# Patient Record
Sex: Female | Born: 1980 | Race: White | Hispanic: No | Marital: Married | Smoking: Former smoker
Health system: Southern US, Community
[De-identification: ages and names within clinical notes are randomized; demographics above are authoritative.]

## PROBLEM LIST (undated history)

## (undated) DIAGNOSIS — K602 Anal fissure, unspecified: Secondary | ICD-10-CM

## (undated) DIAGNOSIS — L811 Chloasma: Secondary | ICD-10-CM

## (undated) DIAGNOSIS — D229 Melanocytic nevi, unspecified: Secondary | ICD-10-CM

## (undated) HISTORY — PX: WISDOM TOOTH EXTRACTION: SHX21

## (undated) HISTORY — PX: CHOLECYSTECTOMY: SHX55

## (undated) HISTORY — DX: Anal fissure, unspecified: K60.2

## (undated) HISTORY — DX: Melanocytic nevi, unspecified: D22.9

## (undated) HISTORY — DX: Chloasma: L81.1

---

## 2006-05-06 ENCOUNTER — Ambulatory Visit: Payer: Self-pay | Admitting: Family Medicine

## 2006-05-06 DIAGNOSIS — R1011 Right upper quadrant pain: Secondary | ICD-10-CM | POA: Insufficient documentation

## 2006-05-06 DIAGNOSIS — D239 Other benign neoplasm of skin, unspecified: Secondary | ICD-10-CM | POA: Insufficient documentation

## 2006-05-11 ENCOUNTER — Encounter: Payer: Self-pay | Admitting: Family Medicine

## 2006-05-12 ENCOUNTER — Telehealth: Payer: Self-pay | Admitting: Family Medicine

## 2006-05-25 ENCOUNTER — Encounter: Payer: Self-pay | Admitting: Family Medicine

## 2006-05-25 ENCOUNTER — Ambulatory Visit: Payer: Self-pay | Admitting: Cardiology

## 2006-05-31 ENCOUNTER — Ambulatory Visit: Payer: Self-pay | Admitting: Cardiology

## 2006-05-31 ENCOUNTER — Encounter: Payer: Self-pay | Admitting: Cardiology

## 2006-06-01 ENCOUNTER — Encounter: Payer: Self-pay | Admitting: Family Medicine

## 2006-06-20 ENCOUNTER — Ambulatory Visit: Payer: Self-pay | Admitting: Cardiology

## 2006-06-24 ENCOUNTER — Encounter: Payer: Self-pay | Admitting: Gastroenterology

## 2006-07-14 ENCOUNTER — Encounter: Payer: Self-pay | Admitting: Family Medicine

## 2006-10-26 ENCOUNTER — Ambulatory Visit: Payer: Self-pay | Admitting: Family Medicine

## 2006-10-26 DIAGNOSIS — K644 Residual hemorrhoidal skin tags: Secondary | ICD-10-CM | POA: Insufficient documentation

## 2006-12-29 ENCOUNTER — Ambulatory Visit: Payer: Self-pay | Admitting: Obstetrics & Gynecology

## 2006-12-29 ENCOUNTER — Encounter: Payer: Self-pay | Admitting: Obstetrics & Gynecology

## 2007-01-10 ENCOUNTER — Ambulatory Visit: Payer: Self-pay | Admitting: Cardiovascular Disease

## 2007-01-10 ENCOUNTER — Ambulatory Visit: Payer: Self-pay | Admitting: Cardiology

## 2007-01-10 LAB — CONVERTED CEMR LAB
Chloride: 101 meq/L (ref 96–112)
GFR calc Af Amer: 86 mL/min
Potassium: 3.5 meq/L (ref 3.5–5.1)
Sodium: 136 meq/L (ref 135–145)

## 2007-01-11 ENCOUNTER — Ambulatory Visit (HOSPITAL_COMMUNITY): Admission: RE | Admit: 2007-01-11 | Discharge: 2007-01-11 | Payer: Self-pay | Admitting: Cardiology

## 2007-10-20 ENCOUNTER — Ambulatory Visit: Payer: Self-pay | Admitting: Women's Health

## 2007-11-10 ENCOUNTER — Ambulatory Visit: Payer: Self-pay | Admitting: Women's Health

## 2007-11-13 ENCOUNTER — Ambulatory Visit: Payer: Self-pay | Admitting: Women's Health

## 2007-11-28 ENCOUNTER — Ambulatory Visit: Payer: Self-pay | Admitting: Gynecology

## 2007-11-30 ENCOUNTER — Ambulatory Visit (HOSPITAL_BASED_OUTPATIENT_CLINIC_OR_DEPARTMENT_OTHER): Admission: RE | Admit: 2007-11-30 | Discharge: 2007-11-30 | Payer: Self-pay | Admitting: Gynecology

## 2007-11-30 ENCOUNTER — Ambulatory Visit: Payer: Self-pay | Admitting: Gynecology

## 2007-11-30 ENCOUNTER — Encounter: Payer: Self-pay | Admitting: Gynecology

## 2007-12-13 ENCOUNTER — Ambulatory Visit: Payer: Self-pay | Admitting: Gynecology

## 2008-02-19 ENCOUNTER — Ambulatory Visit: Payer: Self-pay | Admitting: Gynecology

## 2008-02-19 ENCOUNTER — Encounter: Payer: Self-pay | Admitting: Gynecology

## 2008-02-19 ENCOUNTER — Other Ambulatory Visit: Admission: RE | Admit: 2008-02-19 | Discharge: 2008-02-19 | Payer: Self-pay | Admitting: Gynecology

## 2008-05-22 ENCOUNTER — Ambulatory Visit: Payer: Self-pay | Admitting: Gynecology

## 2008-06-05 ENCOUNTER — Ambulatory Visit: Payer: Self-pay | Admitting: Gynecology

## 2009-01-24 ENCOUNTER — Inpatient Hospital Stay (HOSPITAL_COMMUNITY): Admission: RE | Admit: 2009-01-24 | Discharge: 2009-01-26 | Payer: Self-pay | Admitting: Obstetrics and Gynecology

## 2009-04-16 ENCOUNTER — Ambulatory Visit: Payer: Self-pay | Admitting: Family Medicine

## 2009-04-16 DIAGNOSIS — L819 Disorder of pigmentation, unspecified: Secondary | ICD-10-CM | POA: Insufficient documentation

## 2009-04-16 DIAGNOSIS — R1013 Epigastric pain: Secondary | ICD-10-CM | POA: Insufficient documentation

## 2009-04-17 ENCOUNTER — Encounter: Payer: Self-pay | Admitting: Family Medicine

## 2009-04-17 LAB — CONVERTED CEMR LAB
ALT: 30 units/L (ref 0–35)
AST: 22 units/L (ref 0–37)
Albumin: 4.5 g/dL (ref 3.5–5.2)
Alkaline Phosphatase: 79 units/L (ref 39–117)
Amylase: 40 units/L (ref 0–105)
Basophils Absolute: 0 10*3/uL (ref 0.0–0.1)
Basophils Relative: 0 % (ref 0–1)
Eosinophils Absolute: 0.4 10*3/uL (ref 0.0–0.7)
Eosinophils Relative: 5 % (ref 0–5)
Glucose, Bld: 70 mg/dL (ref 70–99)
Hemoglobin: 14.5 g/dL (ref 12.0–15.0)
Lipase: 23 units/L (ref 0–75)
Lymphocytes Relative: 35 % (ref 12–46)
MCHC: 32.8 g/dL (ref 30.0–36.0)
Monocytes Absolute: 0.7 10*3/uL (ref 0.1–1.0)
Monocytes Relative: 9 % (ref 3–12)
Neutro Abs: 3.7 10*3/uL (ref 1.7–7.7)
Total Bilirubin: 0.7 mg/dL (ref 0.3–1.2)
Total Protein: 6.6 g/dL (ref 6.0–8.3)

## 2009-04-28 ENCOUNTER — Telehealth: Payer: Self-pay | Admitting: Family Medicine

## 2009-04-29 ENCOUNTER — Encounter (INDEPENDENT_AMBULATORY_CARE_PROVIDER_SITE_OTHER): Payer: Self-pay | Admitting: *Deleted

## 2009-05-02 ENCOUNTER — Telehealth: Payer: Self-pay | Admitting: Gastroenterology

## 2009-05-02 ENCOUNTER — Telehealth: Payer: Self-pay | Admitting: Family Medicine

## 2009-05-14 ENCOUNTER — Ambulatory Visit: Payer: Self-pay | Admitting: Gastroenterology

## 2009-05-14 DIAGNOSIS — K602 Anal fissure, unspecified: Secondary | ICD-10-CM | POA: Insufficient documentation

## 2009-05-19 ENCOUNTER — Telehealth: Payer: Self-pay | Admitting: Gastroenterology

## 2009-05-20 ENCOUNTER — Ambulatory Visit (HOSPITAL_COMMUNITY): Admission: RE | Admit: 2009-05-20 | Discharge: 2009-05-20 | Payer: Self-pay | Admitting: Gastroenterology

## 2009-05-28 ENCOUNTER — Ambulatory Visit: Payer: Self-pay | Admitting: Gastroenterology

## 2009-06-26 ENCOUNTER — Telehealth: Payer: Self-pay | Admitting: Gastroenterology

## 2009-06-30 ENCOUNTER — Ambulatory Visit: Payer: Self-pay | Admitting: Cardiology

## 2009-06-30 DIAGNOSIS — Q231 Congenital insufficiency of aortic valve: Secondary | ICD-10-CM | POA: Insufficient documentation

## 2010-02-12 ENCOUNTER — Encounter: Payer: Self-pay | Admitting: Gastroenterology

## 2010-02-12 ENCOUNTER — Ambulatory Visit
Admission: RE | Admit: 2010-02-12 | Discharge: 2010-02-12 | Payer: Self-pay | Source: Home / Self Care | Attending: Gastroenterology | Admitting: Gastroenterology

## 2010-02-17 ENCOUNTER — Encounter: Payer: Self-pay | Admitting: Gastroenterology

## 2010-02-17 ENCOUNTER — Ambulatory Visit
Admission: RE | Admit: 2010-02-17 | Discharge: 2010-02-17 | Payer: Self-pay | Source: Home / Self Care | Attending: Gastroenterology | Admitting: Gastroenterology

## 2010-02-18 ENCOUNTER — Encounter: Payer: Self-pay | Admitting: Gastroenterology

## 2010-03-06 ENCOUNTER — Other Ambulatory Visit: Payer: Self-pay | Admitting: Obstetrics and Gynecology

## 2010-03-09 ENCOUNTER — Encounter: Payer: Self-pay | Admitting: Gastroenterology

## 2010-03-10 NOTE — Op Note (Signed)
Summary: Cholecystectomy/[facility]   Imported By: Sherian Rein 05/19/2009 08:42:43  _____________________________________________________________________  External Attachment:    Type:   Image     Comment:   External Document

## 2010-03-10 NOTE — Letter (Signed)
Summary: Results Letter  [facility]  [address] [place], [place] [address]   Phone: [phone]  Fax: [phone]        May 14, 2009 MRN: [id number]    [facility] [address] [place], [place]  [address]    Dear [name],  It is my pleasure to have treated you recently as a new patient in my office. I appreciate your confidence and the opportunity to participate in your care.  Since I do have a busy inpatient endoscopy schedule and office schedule, my office hours vary weekly. I am, however, available for emergency calls everyday through my office. If I am not available for an urgent office appointment, another one of our gastroenterologist will be able to assist you.  My well-trained staff are prepared to help you at all times. For emergencies after office hours, a physician from our Gastroenterology section is always available through my 24 hour answering service  Once again I welcome you as a new patient and I look forward to a happy and healthy relationship             Sincerely,  Louis Meckel MD  This letter has been electronically signed by your physician.  Appended Document: Results Letter Letter mailed to patient.

## 2010-03-10 NOTE — Letter (Signed)
Summary: New Patient letter  [facility]  [address] [place], [place] [address]   Phone: [phone]  Fax: [phone]       04/29/2009 MRN: [id number]  [facility] [place] [address] [place], [place]  [address]  Dear Ms. Errico,  Welcome to the Gastroenterology Division at [facility].    You are scheduled to see Dr.  Melvia Heaps on May 28, 2009 at 10:00am on the 3rd floor at [facility], [address]  We ask that you try to arrive at our office 15 minutes prior to your appointment time to allow for check-in.  We would like you to complete the enclosed self-administered evaluation form prior to your visit and bring it with you on the day of your appointment.  We will review it with you.  Also, please bring a complete list of all your medications or, if you prefer, bring the medication bottles and we will list them.  Please bring your insurance card so that we may make a copy of it.  If your insurance requires a referral to see a specialist, please bring your referral form from your primary care physician.  Co-payments are due at the time of your visit and may be paid by cash, check or credit card.     Your office visit will consist of a consult with your physician (includes a physical exam), any laboratory testing he/she may order, scheduling of any necessary diagnostic testing (e.g. x-ray, ultrasound, CT-scan), and scheduling of a procedure (e.g. Endoscopy, Colonoscopy) if required.  Please allow enough time on your schedule to allow for any/all of these possibilities.    If you cannot keep your appointment, please call [phone] to cancel or reschedule prior to your appointment date.  This allows [place] the opportunity to schedule an appointment for another patient in need of care.  If you do not cancel or reschedule by 5 p.m. the business day prior to your appointment date, you will be charged a $50.00 late cancellation/no-show fee.    Thank you for  choosing [facility] for your medical needs.  We appreciate the opportunity to care for you.  Please visit [place] at our website  to learn more about our practice.                     Sincerely,                                                             The Gastroenterology Division

## 2010-03-10 NOTE — Progress Notes (Signed)
Summary: Rx did not get her prescriptions yet  Phone Note Call from Patient Call back at Work Phone [phone]   Call For: Dr Arlyce Dice Summary of Call: [facility] [address] [facility] and they have not received her Anusol or Hyomax yet. Initial call taken by: Leanor Kail [facility],  May 19, 2009 10:41 AM  Follow-up for Phone Call        rxs sent, pt aware Follow-up by: Harlow Mares CMA Duncan Dull),  May 19, 2009 11:17 AM    Prescriptions: ANUSOL-HC 25 MG SUPP (HYDROCORTISONE ACETATE) take one suppository q.h.s.  #7 x 3   Entered by:   Harlow Mares CMA (AAMA)   Authorized by:   Louis Meckel MD   Signed by:   Harlow Mares CMA (AAMA) on 05/19/2009   Method used:   Electronically to        [facility] ([facility])       [address]       [place], [place]  [address]       Ph: [phone]       Fax: [phone]   RxID:   [id number] HYOMAX-SL 0.125 MG SUBL (HYOSCYAMINE SULFATE) take 2 tablets sublingual q.4 h. p.r.n.  #20 x 1   Entered by:   Harlow Mares CMA (AAMA)   Authorized by:   Louis Meckel MD   Signed by:   Harlow Mares CMA (AAMA) on 05/19/2009   Method used:   Electronically to        [facility])       [address]       [place], [place]  [address]       Ph: [phone]       Fax: [phone]   RxID:   [id number]

## 2010-03-10 NOTE — Progress Notes (Signed)
Summary: med for abd. pain?  Phone Note Call from Patient   Caller: Patient  [phone] Call For: Nani Gasser MD Summary of Call: Pt. called and stated that she can't see GI until April. She is out of Protonix samples. She states she needs something to take for the abdominal pain. Feels the Protonix helped dull the pain and would rx for that. Can we send in rx?  Mervin Kung CMA  May 02, 2009 12:35 PM   Follow-up for Phone Call        Yes, can fill protonix.  Follow-up by: Nani Gasser MD,  May 05, 2009 7:56 AM    New/Updated Medications: PROTONIX 40 MG TBEC (PANTOPRAZOLE SODIUM) Take 1 tablet by mouth once a day Prescriptions: PROTONIX 40 MG TBEC (PANTOPRAZOLE SODIUM) Take 1 tablet by mouth once a day  #30 x 1   Entered and Authorized by:   Nani Gasser MD   Signed by:   Nani Gasser MD on 05/05/2009   Method used:   Electronically to        [facility]       [address]       [place], [place]  [address]       Ph: [phone]       Fax: [phone]   RxID:   [id number]

## 2010-03-10 NOTE — Progress Notes (Signed)
Summary: Abdominal pain  Phone Note Call from Patient Call back at [phone]   Caller: Patient Call For: Nani Gasser MD Summary of Call: Pt put on the Protonix for abdominal pain and is out of it and feels it did not help her. Initial call taken by: Kathlene November,  April 28, 2009 8:12 AM  Follow-up for Phone Call        REc referral to GI for further evaluation.  Follow-up by: Nani Gasser MD,  April 28, 2009 8:13 AM  Additional Follow-up for Phone Call Additional follow up Details #1::        Pt notified of MD instructions Additional Follow-up by: Kathlene November,  April 28, 2009 8:49 AM

## 2010-03-10 NOTE — Assessment & Plan Note (Signed)
Summary: EPIGASTRIC PAIN...EM   History of Present Illness Visit Type: Initial Consult Primary GI MD: Melvia Heaps MD [facility] Primary Provider: Nani Gasser, MD Requesting Provider: Nani Gasser, MD Chief Complaint: Constant epigastric pain that radiates to back, pain last 10 to 30 minute intervals. History of Present Illness:   [name] is a pleasant 30 year old white female referred at the request of Dr. Linford Arnold for evaluation of abdominal pain.  Over the past 3 months she has been complaining of episodic severe epigastric pain.  Patient may awaken her.  Pain radiates in a bandlike distribution around both sides to her back.  It may last up to 30 minutes.  It is unrelated to eating.  It is not accompanied by nausea or vomiting.  There is no history of pyrosis.  Patient is status post cholecystectomy.  Intraoperative cholangiogram was normal.  Recent liver tests were normal.  There is no history of jaundice  Patient also complains of moderately severe rectal discomfort with a bowel movement.  She denies rectal bleeding.   GI Review of Systems    Reports abdominal pain.     Location of  Abdominal pain: epigastric area.    Denies acid reflux, belching, bloating, chest pain, dysphagia with liquids, dysphagia with solids, heartburn, loss of appetite, nausea, vomiting, vomiting blood, weight loss, and  weight gain.      Reports hemorrhoids.     Denies anal fissure, black tarry stools, change in bowel habit, constipation, diarrhea, diverticulosis, fecal incontinence, heme positive stool, irritable bowel syndrome, jaundice, light color stool, liver problems, rectal bleeding, and  rectal pain.    Current Medications (verified): 1)  Mirena 20 Mcg/24hr Iud (Levonorgestrel) 2)  Concept Dha 53.5-38-1 Mg Caps (Prenat-Fefum-Fepo-Fa-Omega 3) .... Take One Tablet By Mouth Once A Day  Allergies (verified): No Known Drug Allergies  Past History:  Past Medical History: Reviewed history  from 05/06/2006 and no changes required. Hx melanoma 07/17/2005  Past Surgical History: Reviewed history from 07/14/2006 and no changes required. Wisdom Teeth removed 07/1997 Cholecystectomy 07-18-06 by Dr. Manus Rudd  Family History: Father- Cardiomyopathy- deceased 07/18/1999 2 Brothers- Enlarged Aorta Family History of Colitis/Crohn's:Father  Social History: Reviewed history from 04/16/2009 and no changes required. Pt. is married to Yahoo. 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 Former Smoker Alcohol use-yes Regular exercise-yes  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  Review of Systems  The patient denies allergy/sinus, anemia, anxiety-new, arthritis/joint pain, back pain, blood in urine, breast changes/lumps, change in vision, confusion, cough, coughing up blood, depression-new, fainting, fatigue, fever, headaches-new, hearing problems, heart murmur, heart rhythm changes, itching, menstrual pain, muscle pains/cramps, night sweats, nosebleeds, pregnancy symptoms, shortness of breath, skin rash, sleeping problems, sore throat, swelling of feet/legs, swollen lymph glands, thirst - excessive , urination - excessive , urination changes/pain, urine leakage, vision changes, and voice change.    Vital Signs:  Patient profile:   30 year old female Height:      162 inches Weight:      64 pounds BMI:     1.72 Pulse rate:   64 / minute Pulse rhythm:   regular BP sitting:   104 / 70  (left arm) Cuff size:   regular  Vitals Entered By: [name] CMA Duncan Dull) (May 14, 2009 9:00 AM)  Physical Exam  Additional Exam:  On physical exam she is a well-developed large female  skin: anicteric HEENT: normocephalic; PEERLA; no nasal or pharyngeal abnormalities neck: supple nodes: no cervical lymphadenopathy  chest: clear to ausculatation and percussion heart: no murmurs, gallops, or rubs abd: soft, nontender; BS normoactive; no abdominal masses, tenderness,  organomegaly rectal: no masses; a posterior sessile polyps present with a midline fissure ext: no cynanosis, clubbing, edema skeletal: no deformities neuro: oriented x 3; no focal abnormalities    Impression & Recommendations:  Problem # 1:  EPIGASTRIC PAIN (ICD-789.06)  Abdominal pain is suggestive of biliary tract disease such as a retained stone or biliary dyskinesia.  Presentation is atypical for peptic ulcer disease.  Recommendations #1 trial of hyomax p.r.n. pain #2 abdominal ultrasound #3 stat LFTs, amylase and lipase during episodes of pain  Orders: Ultrasound Abdomen (UAS)  Problem # 2:  ANAL FISSURE (ICD-565.0) Recommend warm soaks and Anusol-HC suppositories  Patient Instructions: 1)  Please pick up your medications at your pharmacy.  2)  Your ultrasound is scheduled at [facility] on 05/20/09.  See seperate instructions. 3)  Please call our office when you are having an episode of acute pain so we can order labs. 4)  Please schedule a follow-up appointment in 2 weeks.  5)  Copy sent to : Nani Gasser, MD 6)  The medication list was reviewed and reconciled.  All changed / newly prescribed medications were explained.  A complete medication list was provided to the patient / caregiver. Prescriptions: ANUSOL-HC 25 MG SUPP (HYDROCORTISONE ACETATE) take one suppository q.h.s.  #7 x 3   Entered and Authorized by:   Louis Meckel MD   Signed by:   Louis Meckel MD on 05/14/2009   Method used:   Historical   RxID:   [id number] HYOMAX-SL 0.125 MG SUBL (HYOSCYAMINE SULFATE) take 2 tablets sublingual q.4 h. p.r.n.  #20 x 1   Entered and Authorized by:   Louis Meckel MD   Signed by:   Louis Meckel MD on 05/14/2009   Method used:   Historical   RxID:   [id number]

## 2010-03-10 NOTE — Progress Notes (Signed)
Summary: triage  Phone Note Call from Patient Call back at Home Phone [phone]   Caller: Patient Call For: Dr. Arlyce Dice Reason for Call: Talk to Nurse Summary of Call: pt said that when she had high abd pain again, Dr. Arlyce Dice told her that she needed to come in asap for labwork while she was having the pain Initial call taken by: Vallarie Mare,  Jun 26, 2009 3:18 PM  Follow-up for Phone Call        Pt. contacted and told to come over for LFT's but the pain is now gone as it only lasted 5 or 6 minutes. Follow-up by: Teryl Lucy RN,  Jun 26, 2009 3:26 PM

## 2010-03-10 NOTE — Assessment & Plan Note (Signed)
Summary: f/u from ultrasound--ch.   History of Present Illness Visit Type: Follow-up Visit Primary GI MD: Melvia Heaps MD [facility] Primary Provider: Nani Gasser, MD Requesting Provider: n/a Chief Complaint: discuss ultrasound, pt is having less severe pain, but more frequently History of Present Illness:   [name] has returned for followup of her abdominal pain and her anal fissure.  She has not had any episodes of severe pain since her last visit.  Abdominal ultrasound was unrevealing.  She has not had occasion to take anticholinergics her anal fissure is considerably improved with suppositories.   GI Review of Systems    Reports abdominal pain.     Location of  Abdominal pain: epigastric area.    Denies acid reflux, belching, bloating, chest pain, dysphagia with liquids, dysphagia with solids, heartburn, loss of appetite, nausea, vomiting, vomiting blood, weight loss, and  weight gain.        Denies anal fissure, black tarry stools, change in bowel habit, constipation, diarrhea, diverticulosis, fecal incontinence, heme positive stool, hemorrhoids, irritable bowel syndrome, jaundice, light color stool, liver problems, rectal bleeding, and  rectal pain. Preventive Screening-Counseling & Management      Drug Use:  no.      Current Medications (verified): 1)  Mirena 20 Mcg/24hr Iud (Levonorgestrel) 2)  Concept Dha 53.5-38-1 Mg Caps (Prenat-Fefum-Fepo-Fa-Omega 3) .... Take One Tablet By Mouth Once A Day 3)  Hyomax-Sl 0.125 Mg Subl (Hyoscyamine Sulfate) .... Take 2 Tablets Sublingual Q.4 H. P.r.n. 4)  Anusol-Hc 25 Mg  Supp (Hydrocortisone Acetate) .... Insert One Suppository Rectally At Bedtime  Allergies (verified): No Known Drug Allergies  Past History:  Past Medical History: Hx melanoma 2007 Anal Fissure Hemorrhoids  Past Surgical History: Reviewed history from 07/14/2006 and no changes required. Wisdom Teeth removed 07/1997 Cholecystectomy 06/2006 by Dr. Manus Rudd  Family History: Reviewed history from 05/14/2009 and no changes required. Father- Cardiomyopathy- deceased 4 2 Brothers- Enlarged Aorta Family History of Colitis/Crohn's:Father  Social History: Pt. is married to [employer]. 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 Former Smoker Alcohol use-yes Regular exercise-yes Illicit Drug Use - no Drug Use:  no  Review of Systems  The patient denies allergy/sinus, anemia, anxiety-new, arthritis/joint pain, back pain, blood in urine, breast changes/lumps, confusion, cough, coughing up blood, depression-new, fainting, fatigue, fever, headaches-new, hearing problems, heart murmur, heart rhythm changes, itching, menstrual pain, muscle pains/cramps, night sweats, nosebleeds, pregnancy symptoms, shortness of breath, skin rash, sleeping problems, sore throat, swelling of feet/legs, swollen lymph glands, thirst - excessive, urination - excessive, urination changes/pain, urine leakage, vision changes, and voice change.    Vital Signs:  Patient profile:   30 year old female Height:      64 inches Weight:      159 pounds BMI:     27.39 Pulse rate:   72 / minute Pulse rhythm:   regular BP sitting:   100 / 60  (left arm) Cuff size:   regular  Vitals Entered By: Francee Piccolo CMA Duncan Dull) (May 28, 2009 2:23 PM)   Impression & Recommendations:  Problem # 1:  RUQ PAIN (ICD-789.01) Etiology of her pain is uncertain.  I carefully instructed her to take hyomax with the onset of pain in the hope that it will aborted altogether.  If pain worsens despite hyomax she was instructed to contact me where we can check some liver tests ASAP.  Problem # 2:  ANAL FISSURE (ICD-565.0) Assessment: Improved  Patient  Instructions: 1)  Copy sent to : Michell Heinrich 2)  The medication list was reviewed and reconciled.  All changed / newly prescribed medications were explained.  A complete medication list was  provided to the patient / caregiver.
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
## 2010-03-10 NOTE — Assessment & Plan Note (Signed)
Summary: F3Y/ PER CHECK OUT/ GD   Primary Provider:  Nani Gasser, MD   History of Present Illness: Mrs. Deamer  pleasant female who has a family history of cardiomyopathy, dilated aorta and bicuspid aortic valve.  An echocardiogram performed on May 31, 2006 showed normal LV function.  Her aortic root apparently was not dilated and the aortic valve was not bicuspid. Cardiac MRI 12/08 revealed no evidence of aortic aneurysm.  Ascending aorta measured 27 mm. The descending aorta measured 15 mm. No evidence of cardiomyopathy.  Quantitative ejection fraction 59% with normal LV cavity size. The aortic valve did not appear bicuspid or morphologically abnormal. Last seen 5/08. Since then, the patient denies any dyspnea on exertion, orthopnea, PND, pedal edema, palpitations, syncope or chest pain.   Current Medications (verified): 1)  Mirena 20 Mcg/24hr Iud (Levonorgestrel) .... As Drected 2)  Concept Dha 53.5-38-1 Mg Caps (Prenat-Fefum-Fepo-Fa-Omega 3) .... Take One Tablet By Mouth Once A Day 3)  Hyomax-Sl 0.125 Mg Subl (Hyoscyamine Sulfate) .... Take 2 Tablets Sublingual Q.4 H. P.r.n. 4)  Anusol-Hc 25 Mg  Supp (Hydrocortisone Acetate) .... Insert One Suppository Rectally At Bedtime  Allergies: No Known Drug Allergies  Past History:  Past Medical History: Hx melanoma 2007 Anal Fissure Hemorrhoids  Past Surgical History: Reviewed history from 07/14/2006 and no changes required. Wisdom Teeth removed 07/1997 Cholecystectomy 06/2006 by Dr. Manus Rudd  Social History: Reviewed history from 05/28/2009 and no changes required. Pt. is married to Yahoo. 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 Former Smoker Alcohol use-yes Regular exercise-yes Illicit Drug Use - no  Review of Systems       no fevers or chills, productive cough, hemoptysis, dysphasia, odynophagia, melena, hematochezia, dysuria, hematuria, rash, seizure activity,  orthopnea, PND, pedal edema, claudication. Remaining systems are negative.   Vital Signs:  Patient profile:   30 year old female Height:      64 inches Weight:      155 pounds BMI:     26.70 Pulse rate:   75 / minute BP sitting:   112 / 66  Vitals Entered By: Kem Parkinson (Jun 30, 2009 3:23 PM)  Physical Exam  General:  Well-developed well-nourished in no acute distress.  Skin is warm and dry.  HEENT is normal.  Neck is supple. No thyromegaly.  Chest is clear to auscultation with normal expansion.  Cardiovascular exam is regular rate and rhythm.  Abdominal exam nontender or distended. No masses palpated. Extremities show no edema. neuro grossly intact    EKG  Procedure date:  06/30/2009  Findings:      Normal sinus rhythm at a rate of 75. Right axis deviation. No ST changes.  Impression & Recommendations:  Problem # 1:  BICUSPID AORTIC VALVE (ICD-746.4) There is a family history of bicuspid aortic valve, thoracic aortic aneurysm and cardiomyopathy by the patient's report. However previous echocardiogram and cardiac MRI did not reveal a bicuspid valve and her LV function was normal. I will see her back in 2 years and most likely repeat her echocardiogram at that time. Note she is not having any cardiac symptoms including no chest pain or shortness of breath.  Problem # 2:  ANAL FISSURE (ICD-565.0)  Other Orders: EKG w/ Interpretation (93000)  Patient Instructions: 1)  Your physician recommends that you schedule a follow-up appointment in: 2 years with Dr. Jens Som 2)  Your physician recommends that you continue on your current medications as directed. Please refer  to the Current Medication list given to you today.

## 2010-03-10 NOTE — Progress Notes (Signed)
Summary: sooner appt.  Phone Note Call from Patient Call back at [facility] Phone [phone]   Caller: Patient Call For: Dr. Arlyce Dice Reason for Call: Talk to Nurse Summary of Call: Pt. has an appt. on 05-28-09. Having severe abd pain and feels like it is radiating into her back. Would like a sooner appt. Initial call taken by: Karna Christmas,  May 02, 2009 8:53 AM  Follow-up for Phone Call        Appt. moved up to 05/14/2009. Follow-up by: Teryl Lucy RN,  May 02, 2009 9:54 AM

## 2010-03-12 NOTE — Letter (Signed)
Summary: EGD Instructions  [facility]  [address] [place], [place] [address]   Phone: [phone]  Fax: [phone]       [name]    Oct 21, 1980    MRN: [id number]       Procedure Day /Date:02/17/2010 TUESDAY     Arrival Time: 9:30AM     Procedure Time:10:30AM     Location of Procedure:                    [facility] (4th Floor)  PREPARATION FOR ENDOSCOPY   On 02/17/2010  THE DAY OF THE PROCEDURE:  1.   No solid foods, milk or milk products are allowed after midnight the night before your procedure.  2.   Do not drink anything colored red or purple.  Avoid juices with pulp.  No orange juice.  3.  You may drink clear liquids until8:30AM , which is 2 hours before your procedure.                                                                                                CLEAR LIQUIDS INCLUDE: Water Jello Ice Popsicles Tea (sugar ok, no milk/cream) Powdered fruit flavored drinks Coffee (sugar ok, no milk/cream) Gatorade Juice: apple, white grape, white cranberry  Lemonade Clear bullion, consomm, broth Carbonated beverages (any kind) Strained chicken noodle soup Hard Candy   MEDICATION INSTRUCTIONS  Unless otherwise instructed, you should take regular prescription medications with a small sip of water as early as possible the morning of your procedure.           OTHER INSTRUCTIONS  You will need a responsible adult at least 31 years of age to accompany you and drive you home.   This person must remain in the waiting room during your procedure.  Wear loose fitting clothing that is easily removed.  Leave jewelry and other valuables at home.  However, you may wish to bring a book to read or an iPod/MP3 player to listen to music as you wait for your procedure to start.  Remove all body piercing jewelry and leave at home.  Total time from sign-in until discharge is approximately 2-3 hours.  You should go home directly  after your procedure and rest.  You can resume normal activities the day after your procedure.  The day of your procedure you should not:   Drive   Make legal decisions   Operate machinery   Drink alcohol   Return to work  You will receive specific instructions about eating, activities and medications before you leave.    The above instructions have been reviewed and explained to me by   _______________________    I fully understand and can verbalize these instructions _____________________________ Date _________

## 2010-03-12 NOTE — Assessment & Plan Note (Signed)
Summary: upper abd pain, meds not helping...as.   History of Present Illness Visit Type: Follow-up Visit Primary GI MD: Melvia Heaps MD [facility] Primary Provider: Nani Gasser, MD Requesting Provider: n/a Chief Complaint: Upper abd pain and Hyomax not working  History of Present Illness:   Ms. Mcginty has returned for reevaluation of her abdominal pain.  Approximately once a week she has severe upper epigastric pain that radiates around both sides and to the back.  It may last for up to an hour at a time.  It is rated as a 10 out of 10.  She is taking hyomax but thnks it does not seem to help.  At the same time, pain may subside within half an hour or an hourafter  taking hyomax.  It is not accompanied by nausea vomiting or diarrhea.  There is no pattern to her episodes.  It is unrelated to eating and not affected by eating.  Cholecystectomy in Jul 09, 2006 demonstrated calculi.  I OC was normal.  At the time she was having  mild right upper quadrant pain.   GI Review of Systems    Reports abdominal pain.     Location of  Abdominal pain: upper abdomen.    Denies acid reflux, belching, bloating, chest pain, dysphagia with liquids, dysphagia with solids, heartburn, loss of appetite, nausea, vomiting, vomiting blood, weight loss, and  weight gain.        Denies anal fissure, black tarry stools, change in bowel habit, constipation, diarrhea, diverticulosis, fecal incontinence, heme positive stool, hemorrhoids, irritable bowel syndrome, jaundice, light color stool, liver problems, rectal bleeding, and  rectal pain.    Current Medications (verified): 1)  Mirena 20 Mcg/24hr Iud (Levonorgestrel) .... As Drected 2)  Hyomax-Sl 0.125 Mg Subl (Hyoscyamine Sulfate) .... Take 2 Tablets Sublingual Q.4 H. P.r.n. 3)  Anusol-Hc 25 Mg  Supp (Hydrocortisone Acetate) .... As Needed  Allergies (verified): No Known Drug Allergies  Past History:  Past Medical History: Hx melanoma 07/08/05:  BICUSPID AORTIC VALVE  (ICD-746.4) ANAL FISSURE (ICD-565.0) MELASMA (ICD-709.09) EPIGASTRIC PAIN (ICD-789.06) HEMORRHOIDS, EXTERNAL W/O COMPLICATION (ICD-455.3) NEVUS, ATYPICAL (ICD-216.9) RUQ PAIN (ICD-789.01) ISCHEMIC HEART DISEASE, PREMATURE, FAMILY HX (ICD-V17.3)  Past Surgical History: Reviewed history from 07/14/2006 and no changes required. Wisdom Teeth removed 07/1997 Cholecystectomy 07-09-2006 by Dr. Manus Rudd  Family History: Father- Cardiomyopathy- deceased 07/09/1999 2 Brothers- Enlarged Aorta Family History of Colitis/Crohn's:Father No FH of Colon Cancer:  Social History: Reviewed history from 05/28/2009 and no changes required. Pt. is married to [employer]. 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 Former Smoker Alcohol use-yes Regular exercise-yes Illicit Drug Use - no  Vital Signs:  Patient profile:   30 year old female Height:      64 inches Weight:      159 pounds BMI:     27.39 BSA:     1.78 Pulse rate:   88 / minute Pulse rhythm:   regular BP sitting:   124 / 72  (left arm) Cuff size:   regular  Vitals Entered By: Ok Anis CMA (February 12, 2010 1:55 PM)  Physical Exam  Additional Exam:  On physical exam she is a healthy-appearing female  skin: anicteric HEENT: normocephalic; PEERLA; no nasal or pharyngeal abnormalities neck: supple nodes: no cervical lymphadenopathy chest: clear to ausculatation and percussion heart: no murmurs, gallops, or rubs abd: soft, nontender; BS normoactive; no abdominal masses, tenderness, organomegaly rectal: deferred ext: no cynanosis, clubbing, edema skeletal: no deformities  neuro: oriented x 3; no focal abnormalities    Impression & Recommendations:  Problem # 1:  EPIGASTRIC PAIN (ICD-789.06) Assessment Unchanged Symptoms continue.  Etiology is uncertain.  She is not had a robust response to anticholinergics.  This would be an atypical presentation for peptic ulcer disease.  Pain from  biliary dyskinesia or recurrent/retained stones are less likely.  Recommendations #1 the patient was encouraged to take hyomax at the very onset of bowel discomfort #2 upper endoscopy #3 stat evaluation with CBC, amylase and LFTs during episodes of pain  Other Orders: EGD (EGD)  Patient Instructions: 1)  Copy sent to : Nani Gasser, MD 2)  Your EGD is scheduled for 02/17/2010 at 10:30pm 3)  Upper Endoscopy brochure given.  4)  The medication list was reviewed and reconciled.  All changed / newly prescribed medications were explained.  A complete medication list was provided to the patient / caregiver.

## 2010-03-12 NOTE — Procedures (Signed)
Summary: Upper Endoscopy  Patient: [name] Note: All result statuses are Final unless otherwise noted.  Tests: (1) Upper Endoscopy (EGD)   EGD Upper Endoscopy       DONE     [facility]     [address]     [place], [place]  [address]           ENDOSCOPY PROCEDURE REPORT           PATIENT:  [name], [name]  MR#:  [id number]     BIRTHDATE:  [birth year], 29 yrs. old  GENDER:  female           ENDOSCOPIST:  Barbette Hair. Arlyce Dice, MD     Referred by:  Nani Gasser, M.D.           PROCEDURE DATE:  02/17/2010     PROCEDURE:  EGD, diagnostic 43235     ASA CLASS:  Class I     INDICATIONS:  abdominal pain           MEDICATIONS:   Fentanyl 25 mcg IV, Versed 4 mg IV, glycopyrrolate     (Robinal) 0.2 mg IV, 0.6cc simethancone 0.6 cc PO     TOPICAL ANESTHETIC:  Exactacain Spray           DESCRIPTION OF PROCEDURE:   After the risks benefits and     alternatives of the procedure were thoroughly explained, informed     consent was obtained.  The [facility] [id number] endoscope was     introduced through the mouth and advanced to the third portion of     the duodenum, without limitations.  The instrument was slowly     withdrawn as the mucosa was fully examined.     <<PROCEDUREIMAGES>>           Esophagitis was found at the gastroesophageal junction (see image9     and image8). Grade B erosive esophagitis  Otherwise the     examination was normal.    Retroflexed views revealed no     abnormalities.    The scope was then withdrawn from the patient     and the procedure completed.           COMPLICATIONS:  None           ENDOSCOPIC IMPRESSION:     1) Esophagitis at the gastroesophageal junction     2) Otherwise normal examination     RECOMMENDATIONS:     1) Protonix 40 mg qd     2) Call office next 2-3 days to schedule an office appointment     for 4-6 weeks     3) STAT evaluation during episodes of severe pain (LFTs, amylase,     CBC)           REPEAT EXAM:  No           ______________________________     Barbette Hair. Arlyce Dice, MD           CC:           n.     eSIGNED:   Barbette Hair. Kaplan at 02/17/2010 10:59 AM           Delray Alt, [id number]  Note: An exclamation mark (!) indicates a result that was not dispersed into the flowsheet. Document Creation Date: 02/17/2010 10:59 AM _______________________________________________________________________  (1) Order result status: Final Collection or observation date-time: 02/17/2010 10:55 Requested date-time:  Receipt date-time:  Reported date-time:  Referring Physician:  Ordering Physician: Melvia Heaps [phone]) Specimen Source:  Source: Launa Grill Order Number: [id number] Lab site:

## 2010-03-12 NOTE — Letter (Signed)
Summary: Appt Reminder 2  [facility]  [address] [place], [place] [address]   Phone: [phone]  Fax: [phone]        February 18, 2010 MRN: [id number]    [facility] [place] [address] [place], [place]  [address]    Dear [name],   You have a return appointment with Dr. Arlyce Dice on 04/01/10 at 10:15am.  Please remember to bring a complete list of the medicines you are taking, your insurance card and your co-pay.  If you have to cancel or reschedule this appointment, please call before 5:00 pm the evening before to avoid a cancellation fee.  If you have any questions or concerns, please call [phone].    Sincerely,    Selinda Michaels RN  Appended Document: Appt Reminder 2 Letter is mailed to the patient's home address

## 2010-03-12 NOTE — Letter (Signed)
Summary: Appt Reminder 2  [facility]  [address] [place], [place] [address]   Phone: [phone]  Fax: [phone]        February 18, 2010 MRN: [id number]    [facility] [place] [address] [place], [place]  [address]    Dear Ms. Lauf,   You have a return appointment with Dr. Arlyce Dice on 04/01/10 at 10:15am.  Please remember to bring a complete list of the medicines you are taking, your insurance card and your co-pay.  If you have to cancel or reschedule this appointment, please call before 5:00 pm the evening before to avoid a cancellation fee.  If you have any questions or concerns, please call [phone].    Sincerely,    Selinda Michaels RN

## 2010-03-12 NOTE — Miscellaneous (Signed)
  Clinical Lists Changes  Medications: Added new medication of PROTONIX 40 MG SOLR (PANTOPRAZOLE SODIUM) take 1 tab before breakfast once daily - Signed Rx of PROTONIX 40 MG SOLR (PANTOPRAZOLE SODIUM) take 1 tab before breakfast once daily;  #30 x 1;  Signed;  Entered by: Louis Meckel MD;  Authorized by: Louis Meckel MD;  Method used: Electronically to [facility] [address] [place], [place], [place]  [address], Ph: [phone], Fax: [phone]    Prescriptions: PROTONIX 40 MG SOLR (PANTOPRAZOLE SODIUM) take 1 tab before breakfast once daily  #30 x 1   Entered and Authorized by:   Louis Meckel MD   Signed by:   Louis Meckel MD on 02/17/2010   Method used:   Electronically to        [facility]       [address]       [place], [place]  [address]       Ph: [phone]       Fax: [phone]   RxID:   [id number]

## 2010-03-26 NOTE — Medication Information (Signed)
Summary: Pantoprazole/[facility]  Pantoprazole/[facility]   Imported By: Sherian Rein 03/18/2010 09:49:23  _____________________________________________________________________  External Attachment:    Type:   Image     Comment:   External Document

## 2010-04-01 ENCOUNTER — Encounter: Payer: Self-pay | Admitting: Gastroenterology

## 2010-04-01 ENCOUNTER — Ambulatory Visit (INDEPENDENT_AMBULATORY_CARE_PROVIDER_SITE_OTHER): Payer: 59 | Admitting: Gastroenterology

## 2010-04-01 DIAGNOSIS — R1013 Epigastric pain: Secondary | ICD-10-CM

## 2010-04-07 NOTE — Assessment & Plan Note (Signed)
Summary: EGD F[facility]   NO SHOW/COPAY   History of Present Illness Visit Type: Follow-up Visit Primary GI MD: Melvia Heaps MD [facility] Primary Provider: Nani Gasser, MD Requesting Provider: n/a Chief Complaint: Patient here to follow up after EGD she states that she is doing good. No complaints.  History of Present Illness:    [name] has returned for followup of her abdominal pain. Since her last visit she has had no further episodes. She has had no occasion to take medications. She has noticed occasional upper epigastric discomfort postprandially. She continues on protonix.   GI Review of Systems      Denies abdominal pain, acid reflux, belching, bloating, chest pain, dysphagia with liquids, dysphagia with solids, heartburn, loss of appetite, nausea, vomiting, vomiting blood, weight loss, and  weight gain.        Denies anal fissure, black tarry stools, change in bowel habit, constipation, diarrhea, diverticulosis, fecal incontinence, heme positive stool, hemorrhoids, irritable bowel syndrome, jaundice, light color stool, liver problems, rectal bleeding, and  rectal pain.    Current Medications (verified): 1)  Mirena 20 Mcg/24hr Iud (Levonorgestrel) .... As Drected 2)  Hyomax-Sl 0.125 Mg Subl (Hyoscyamine Sulfate) .... Take 2 Tablets Sublingual Q.4 H. P.r.n. 3)  Anusol-Hc 25 Mg  Supp (Hydrocortisone Acetate) .... As Needed 4)  Protonix 40 Mg Solr (Pantoprazole Sodium) .... Take 1 Tab Before Breakfast Once Daily  Allergies (verified): No Known Drug Allergies  Past History:  Past Medical History: Reviewed history from 02/12/2010 and no changes required. Hx melanoma 2007:  BICUSPID AORTIC VALVE (ICD-746.4) ANAL FISSURE (ICD-565.0) MELASMA (ICD-709.09) EPIGASTRIC PAIN (ICD-789.06) HEMORRHOIDS, EXTERNAL W/O COMPLICATION (ICD-455.3) NEVUS, ATYPICAL (ICD-216.9) RUQ PAIN (ICD-789.01) ISCHEMIC HEART DISEASE, PREMATURE, FAMILY HX (ICD-V17.3)  Past Surgical  History: Reviewed history from 07/14/2006 and no changes required. Wisdom Teeth removed 07/1997 Cholecystectomy 06/2006 by Dr. Manus Rudd  Family History: Reviewed history from 02/12/2010 and no changes required. Father- Cardiomyopathy- deceased 57 2 Brothers- Enlarged Aorta Family History of Colitis/Crohn's:Father No FH of Colon Cancer:  Social History: Reviewed history from 05/28/2009 and no changes required. Pt. is married to [employer]. 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 Former Smoker Alcohol use-yes Regular exercise-yes Illicit Drug Use - no  Review of Systems  The patient denies allergy/sinus, anemia, anxiety-new, arthritis/joint pain, back pain, blood in urine, breast changes/lumps, change in vision, confusion, cough, coughing up blood, depression-new, fainting, fatigue, fever, headaches-new, hearing problems, heart murmur, heart rhythm changes, itching, menstrual pain, muscle pains/cramps, night sweats, nosebleeds, pregnancy symptoms, shortness of breath, skin rash, sleeping problems, sore throat, swelling of feet/legs, swollen lymph glands, thirst - excessive , urination - excessive , urination changes/pain, urine leakage, vision changes, and voice change.    Vital Signs:  Patient profile:   30 year old female Height:      64 inches Weight:      156.6 pounds BMI:     26.98 Pulse rate:   80 / minute Pulse rhythm:   regular BP sitting:   110 / 62  (left arm) Cuff size:   regular  Vitals Entered By: Harlow Mares CMA Duncan Dull) (April 01, 2010 10:25 AM)   Impression & Recommendations:  Problem # 1:  EPIGASTRIC PAIN (ICD-789.06) Assessment Improved  Etiology of her pain was never clearly established. She is symptom-free.   Recommendations #1 continue Protonix for another 4 weeks and then attempt to discontinue. #2 hymax prn #3 stat evaluation during episodes of pain  Patient Instructions: 1)  Copy sent to :  Nani Gasser, MD 2)  Call back as needed  3)  The medication list was reviewed and reconciled.  All changed / newly prescribed medications were explained.  A complete medication list was provided to the patient / caregiver.

## 2010-05-11 LAB — CBC
HCT: 39.6 % (ref 36.0–46.0)
Hemoglobin: 13.4 g/dL (ref 12.0–15.0)
MCHC: 33.8 g/dL (ref 30.0–36.0)
MCV: 89.6 fL (ref 78.0–100.0)
Platelets: 114 10*3/uL — ABNORMAL LOW (ref 150–400)
Platelets: 137 10*3/uL — ABNORMAL LOW (ref 150–400)
RDW: 13.1 % (ref 11.5–15.5)

## 2010-06-23 NOTE — Op Note (Signed)
NAMESAE, HANDRICH                ACCOUNT NO.:  [id number]   MEDICAL RECORD NO.:  [id number]          PATIENT TYPE:  AMB   LOCATION:  [facility]                         FACILITY:  [facility]   PHYSICIAN:  Juan H. Lily Peer, M.D.DATE OF BIRTH:  May 25, 1980   DATE OF PROCEDURE:  DATE OF DISCHARGE:                               OPERATIVE REPORT   INDICATIONS FOR OPERATION:  A 30 year old gravida 1, para 0 now AB 1  with the first trimester miscarriage with decreasing quantitative beta  hCG's, and ultrasound confirming no evidence of fetal viability.  The  patient's blood type is AB positive.   PREOPERATIVE DIAGNOSIS:  First trimester missed AB.   POSTOPERATIVE DIAGNOSIS:  First trimester missed AB.   ANESTHESIA:  General endotracheal anesthesia.   PROCEDURE PERFORMED:  Dilatation and evacuation.   FINDINGS:  A 6-to- 8 week sized uterus with no palpable adnexal masses  on exam under anesthesia.   DESCRIPTION OF OPERATION:  After the patient was adequately counseled,  she was taken to the operating room where she underwent a successful  general endotracheal anesthesia.  She received a g of Ancef for  prophylaxis.  She was placed in a low lithotomy position.  She had her  bladder evacuated with an [employer] approximately even less  than 25 mL.  Bimanual examination demonstrated a 6 to 8 week sized  uterus with no palpable adnexal masses.  The vagina and perineum were  prepped and draped in usual sterile fashion.  A Graves speculum was  inserted into the vaginal vault.  The anterior cervical lip was grasped  with a single-tooth tenaculum.  Pratt dilators were required to dilate  her cervix.  The uterus sounded to approximately 8 cm, and an 8-French  suction curette was introduced into the intrauterine cavity for removal  of the products of conception.  This was interchanged with a serrated  curette to completely evacuate the uterine of its contents.  The single-  tooth tenaculum was  removed.  The patient tolerated the procedure well.  She was extubated and transferred to recovery room with stable vital  signs.  She received Toradol 30 mg IV en route to the recovery room.   IV FLUIDS:  500 mL of lactated Ringer's.      Juan H. Lily Peer, M.D.  Electronically Signed     JHF/MEDQ  D:  11/30/2007  T:  11/30/2007  Job:  [id number]

## 2010-06-23 NOTE — H&P (Signed)
[name], [name]                ACCOUNT NO.:  [id number]   MEDICAL RECORD NO.:  [id number]          PATIENT TYPE:  AMB   LOCATION:  [facility]                         FACILITY:  [facility]   PHYSICIAN:  Juan H. Lily Peer, M.D.DATE OF BIRTH:  [birth year]   DATE OF ADMISSION:  11/30/2007  DATE OF DISCHARGE:                              HISTORY & PHYSICAL   The patient is scheduled for surgery on Thursday, October 22nd, at 01:15  at [facility].  Please have history and physical  available.   CHIEF COMPLAINT:  First trimester missed AB.   HISTORY OF PRESENT ILLNESS:  The patient is a 30 year old gravida 1,  para 0 who was seen in the office on October 20th, complaining of some  bleeding that she had notice when she was straining and having a bowel  movement.  She had known that she was pregnant because she had been seen  in the office on July 23rd and October 2nd, respectively.  She had  quantitative beta hCG's which demonstrated a count on October 2nd at  24,242, on October 5th, it had risen to 35,099, and on October 20th had  dropped to 29,545.  Her blood type is AB positive.  She had had one  ultrasound in the office on October 2nd and she was 7 weeks and 2 days  by last menstrual period in 5 weeks and 6 days by ultrasound and only  gestational sac was seen with no definite fetal pole.  The ultrasound on  October 20th confirmed that she had a first trimester missed AB whereby  by her last menstrual period she would be 9 weeks and 6 days and by  ultrasound 6 weeks and 6 days.  Once again a gestational sac was seen.  No fetal pole or cardiac activity was noted with these findings, and the  dropping of her quantitative beta hCG demonstrates that there is  evidence of a missed AB.  We will proceed with a D&E.  Options have been  provided to the patient and she will like to go ahead and proceed with a  D&C.   PAST MEDICAL HISTORY:  1. The patient has history of cholecystectomy  in May 2008.  2. Wisdom tooth removal in 1999.   ALLERGIES:  The patient denies any allergies.   FAMILY HISTORY:  Father died of heart disease and maternal grandmother  with breast cancer.   PHYSICAL EXAMINATION:  GENERAL:  A well-developed, well-nourished female  complaining of vaginal spotting.  VITAL SIGNS:  Weight 146 pounds, height 5 feet 4-1/4th inches tall, and  blood pressure 120/80.  HEENT:  Unremarkable.  NECK:  Supple.  Trachea midline.  No carotid bruits or thyromegaly.  LUNGS:  Clear to auscultation without rhonchi or wheezes.  HEART:  Regular rate and rhythm.  No murmurs or gallop.  BREAST:  Not done.  ABDOMEN:  Soft and nontender.  No rebound or guarding.  PELVIC:  Bartholin, urethra, and Skene are within normal limits.  VAGINA:  Some dark brown blood in the vaginal vault.  Uterus is  approximately 6 to 8  weeks' size.  No palpable adnexal masses.   ASSESSMENT:  This 29 year old gravid 1, para 0 with evidence of a missed  AB, scheduled to undergo D&E.  Risks, benefits, and pros and cons were  discussed.  The patient's blood type is AB positive.  All questions were  answered and we will follow.  We will plan on doing D&E at [facility]  surgical center on Thursday, October 22nd at 01:15, please have history  and physical available.      Juan H. Lily Peer, M.D.  Electronically Signed     JHF/MEDQ  D:  11/29/2007  T:  11/30/2007  Job:  [id number]

## 2010-06-23 NOTE — Group Therapy Note (Signed)
NAMEASHLEIGH, [name]                ACCOUNT NO.:  [id number]   MEDICAL RECORD NO.:  [id number]           PATIENT TYPE:   LOCATION:  [facility]                     FACILITY:   PHYSICIAN:  Johnella Moloney, MD             DATE OF BIRTH:   DATE OF SERVICE:  12/29/2006                                  CLINIC NOTE   CHIEF COMPLAINT:  Yearly exam.   HISTORY OF PRESENT ILLNESS:  Patient is a 30 year old G0 who is here for  her annual exam.  Patient denies any GYN concerns.  She is currently  sexually active with her husband only, and uses Loestrin SD 24 for birth  control.  Patient wants a refill of her Loestrin.  She denies any  intramenstrual bleeding, abnormal vaginal discharge, or any other  symptoms.   PAST MEDICAL HISTORY:  Exercise-induced asthma in the past.   PAST SURGICAL HISTORY:  Laparoscopic gallbladder removal May 2008 here  at [facility].   PAST OB/GYN HISTORY:  G0.  Menarche at age 2.  Patient with regular  menstrual cycles.  However, since she started Loestrin, she has had no  menstrual cycles.  Her last menstrual period was in January 2008.  She  is not pregnant.  Patient had her last Pap smear in November 2007, and  no abnormal Pap smears.  She has had Gardasil vaccine.  She has also  been vaccinated for rubella, tetanus, and flu.   SOCIAL HISTORY:  Patient lives with her husband.  She works as an  Airline pilot.  She denies smoking, alcohol, or illicit drug use.  She also  denies any abuse.   SYSTEMIC REVIEW:  Patient reports no other symptoms.   PHYSICAL EXAMINATION:  Temperature 98.7.  Pulse 73.  Blood pressure  118/75.  Weight 146.4 pounds.  Height 64 inches.  Respirations 20.  GENERAL:  In no apparent distress.  NECK:  Supple.  No masses.  Normal thyroid.  LUNGS:  Clear to auscultation bilaterally.  HEART:  Regular rate and rhythm.  BREASTS:  Symmetric in size.  No abnormal masses.  No tenderness.  No  abnormal skin changes or lymphadenopathy.  ABDOMEN:  Soft and  nontender.  Non-distended.  PELVIC:  Normal external female genitalia.  On speculum exam, pink, well-  rugated vagina.  Normal discharge.  Nulliparous cervical os noted.  Pap  smear sample obtained.  BIMANUAL EXAM:  Small retroverted uterus palpated.  Normal adnexa.  No  tenderness on examination.   ASSESSMENT AND PLAN:  Patient is a 30 year old G0 here for her annual  exam.  Patient has no other concerns.  She had a normal breast  examination.  A Pap smear was sent.  We will follow up on results.  Patient was provided with a refill for her Loestrin 24.  She was given a  prescription for a 19-month supply with 4 refills.  Patient was told  to expect the results of her Pap smear in the mail in the next 2 to 3  weeks, and was advised to return to the GYN clinic for any further  concerns.           ______________________________  Johnella Moloney, MD     UD/MEDQ  D:  12/29/2006  T:  12/30/2006  Job:  [id number]

## 2010-06-23 NOTE — Assessment & Plan Note (Signed)
 HEALTHCARE                            CARDIOLOGY OFFICE NOTE   NAME:Mckamie, LORAL CAMPI                       MRN:          [id number]  DATE:06/20/2006                            DOB:          [birth year]    Mrs. Chunn returns for followup today.  She is a very pleasant 30-year-  old female who has a family history of cardiomyopathy, dilated aorta and  bicuspid aortic valve.  When we saw her last time, we scheduled her to  have an echocardiogram, which was performed on May 31, 2006.  Her LV  function was normal.  Her aortic root apparently was not dilated and the  aortic valve was not bicuspid.  Since that time, she has not had chest  pain or shortness of breath and there are no palpitations or syncope.  Her medications include Loestrin.   PHYSICAL EXAMINATION:  Blood pressure of 120/70 and her pulse is 68.  NECK:  Supple.  CHEST:  Clear.  CARDIOVASCULAR:  Regular rate and rhythm.  EXTREMITIES:  No edema.   DIAGNOSES:  1. Family history of cardiomyopathy, undefined -- her echocardiogram      shows normal left ventricular function.  2. Family history of dilated aorta in her brothers -- we will schedule      her to have an MRI to size her aortic root and for a baseline      study.  3. History of bicuspid aortic valve -- her echocardiogram shows no      bicuspid valve.   I will see her back in 3 years.     Madolyn Frieze Jens Som, MD, [facility]  Electronically Signed    BSC/MedQ  DD: 06/20/2006  DT: 06/21/2006  Job #: [id number]   cc:   Nani Gasser, M.D.

## 2010-06-26 NOTE — Assessment & Plan Note (Signed)
[facility]                            CARDIOLOGY OFFICE NOTE   NAME:[name], [name]                       MRN:          [id number]  DATE:05/25/2006                            DOB:          Mar 21, 1980    [name] is a very pleasant 30 year old female with no prior cardiac  history whom I am asked to evaluate for a strong family history of heart  disease.  Note, she does not have dyspnea on exertion, orthopnea, PND,  pedal edema, palpitations, presyncope, syncope, or exertional chest  pain.  She did state that her father died in his 60s suddenly.  At  autopsy he was found to have a cardiomyopathy, but she does not know the  details.  She also states she has twin brothers who have recently been  diagnosed with dilated aortas.  Also, apparently 1 brother has a  bicuspid aortic valve.  Because of the above, we were asked to further  evaluate.   Her medications include Loestrin.   SHE HAS NO KNOWN DRUG ALLERGIES.   SOCIAL HISTORY:  She has smoked in the past, but quit approximately 1  year ago.  She rarely consumes alcohol.  She does not use recreational  drugs at this point.   FAMILY HISTORY:  As outlined in the HPI.  Again, her father died  suddenly in his 53s, and was found to have a cardiomyopathy of uncertain  details.  She also has brothers who have dilated aortas, and 1 with a  bicuspid aortic valve.   PAST MEDICAL HISTORY:  There is no diabetes mellitus, hypertension, or  hyperlipidemia.  She has had wisdom teeth removed, but no other  surgeries noted.   REVIEW OF SYSTEMS:  She denies any headaches.  No fevers or chills.  There is no productive cough or hemoptysis.  There is no dysphagia,  odynophagia, melena, or hematochezia.  There is no dysuria or hematuria.  There is no rash or seizure activity.  There is no orthopnea, PND, or  pedal edema.  There is no claudication noted.  The remaining systems are  negative.   PHYSICAL  EXAMINATION:  Shows a blood pressure of 106/68.  Pulse is 65.  She weighs 152 pounds.  She is well developed, well nourished, and in no acute distress.  SKIN:  Warm and dry.  She does not appear depressed.  There is no peripheral clubbing.  BACK:  Normal.  HEENT:  Unremarkable with normal eyelids.  NECK:  Supple with normal upstroke bilaterally.  There are no bruits  noted.  There is no jugular venous distention, and no thyromegaly is  noted.  CHEST:  Clear to auscultation with normal expansion.  CARDIOVASCULAR EXAM:  Reveals a regular rate and rhythm with normal S1  and S2.  There are no murmurs, rubs, or gallops noted.  Her PMI is  nondisplaced.  ABDOMINAL EXAM:  Not tender or distended.  Positive bowel sounds.  No  hepatosplenomegaly.  No mass appreciated.  There is no abdominal bruit.  She has 2+ femoral pulses bilaterally.  No bruits.  EXTREMITIES:  Show  no edema.  I could palpate no cords.  She has 2+  dorsalis pedis pulses bilaterally.  NEUROLOGIC EXAM:  Grossly intact.  Her electrocardiogram shows a sinus rhythm at a rate of 82.  The axis is  normal.  There is an RV conduction delay, but there are no ST changes  noted.   DIAGNOSES:  1. Family history of cardiomyopathy, undefined.  2. Family history of dilated aorta in her brothers.  3. History of bicuspid aortic valve in her brother.   PLAN:  [name] is having no cardiac symptoms, but does have a family  history of cardiomyopathy in her father, who died suddenly.  She also  has 2 brothers who have dilated aortas, and 1 with a bicuspid aortic  valve.  We will schedule her to have an echocardiogram to quantify her  left ventricular function, and to exclude any other cardiomyopathies.  I  do not hear any murmurs on exam to suggest bicuspid aortic valve, and  there is no change with Valsalva to suggest hypertrophic cardiomyopathy.  Also, her electrocardiogram is normal.  The echocardiogram will also  help [place] to size her  aortic root, and to exclude bicuspid aortic valve.  I have asked her to obtain further details about her father's autopsy,  and also her brothers' medical condition.  I will see her back in 4  weeks.     Madolyn Frieze Jens Som, MD, [facility]  Electronically Signed    BSC/MedQ  DD: 05/25/2006  DT: 05/25/2006  Job #: [id number]   cc:   Nani Gasser, M.D.

## 2010-09-24 ENCOUNTER — Other Ambulatory Visit: Payer: Self-pay | Admitting: Obstetrics and Gynecology

## 2010-09-24 DIAGNOSIS — N63 Unspecified lump in unspecified breast: Secondary | ICD-10-CM

## 2010-10-01 ENCOUNTER — Other Ambulatory Visit: Payer: Self-pay | Admitting: Obstetrics and Gynecology

## 2010-10-01 ENCOUNTER — Ambulatory Visit
Admission: RE | Admit: 2010-10-01 | Discharge: 2010-10-01 | Disposition: A | Discharge status: Home / Self Care | Payer: 59 | Source: Ambulatory Visit | Attending: Obstetrics and Gynecology | Admitting: Obstetrics and Gynecology

## 2010-10-01 DIAGNOSIS — N63 Unspecified lump in unspecified breast: Secondary | ICD-10-CM

## 2010-11-10 LAB — POCT HEMOGLOBIN-HEMACUE: Hemoglobin: 15.1 — ABNORMAL HIGH

## 2011-02-26 ENCOUNTER — Other Ambulatory Visit: Payer: Self-pay | Admitting: Obstetrics and Gynecology

## 2011-02-26 DIAGNOSIS — R921 Mammographic calcification found on diagnostic imaging of breast: Secondary | ICD-10-CM

## 2011-04-01 ENCOUNTER — Ambulatory Visit
Admission: RE | Admit: 2011-04-01 | Discharge: 2011-04-01 | Disposition: A | Discharge status: Home / Self Care | Payer: 59 | Source: Ambulatory Visit | Attending: Obstetrics and Gynecology | Admitting: Obstetrics and Gynecology

## 2011-04-01 ENCOUNTER — Other Ambulatory Visit: Payer: Self-pay | Admitting: Obstetrics and Gynecology

## 2011-04-01 DIAGNOSIS — R921 Mammographic calcification found on diagnostic imaging of breast: Secondary | ICD-10-CM

## 2011-04-07 ENCOUNTER — Other Ambulatory Visit: Payer: Self-pay | Admitting: Obstetrics and Gynecology

## 2011-04-07 ENCOUNTER — Ambulatory Visit
Admission: RE | Admit: 2011-04-07 | Discharge: 2011-04-07 | Disposition: A | Discharge status: Home / Self Care | Payer: 59 | Source: Ambulatory Visit | Attending: Obstetrics and Gynecology | Admitting: Obstetrics and Gynecology

## 2011-04-07 DIAGNOSIS — R921 Mammographic calcification found on diagnostic imaging of breast: Secondary | ICD-10-CM

## 2011-06-11 ENCOUNTER — Telehealth: Payer: Self-pay | Admitting: *Deleted

## 2011-06-11 NOTE — Telephone Encounter (Signed)
Opened in ERROR

## 2011-06-25 ENCOUNTER — Telehealth: Payer: Self-pay | Admitting: Cardiology

## 2011-06-25 NOTE — Telephone Encounter (Signed)
New Problem:     I called the patient and was unable to reach them. I left a message on their voicemail with my name, the reason I called, the name of his physician, and a number to call back to schedule their appointment. 

## 2011-06-30 ENCOUNTER — Encounter: Payer: Self-pay | Admitting: *Deleted

## 2011-07-07 ENCOUNTER — Encounter: Payer: Self-pay | Admitting: Cardiology

## 2011-07-07 ENCOUNTER — Ambulatory Visit (INDEPENDENT_AMBULATORY_CARE_PROVIDER_SITE_OTHER): Payer: 59 | Admitting: Cardiology

## 2011-07-07 VITALS — BP 110/68 | HR 82 | Ht 64.0 in | Wt 149.0 lb

## 2011-07-07 DIAGNOSIS — Z8249 Family history of ischemic heart disease and other diseases of the circulatory system: Secondary | ICD-10-CM

## 2011-07-07 DIAGNOSIS — I428 Other cardiomyopathies: Secondary | ICD-10-CM

## 2011-07-07 DIAGNOSIS — I429 Cardiomyopathy, unspecified: Secondary | ICD-10-CM | POA: Insufficient documentation

## 2011-07-07 NOTE — Assessment & Plan Note (Signed)
Echocardiogram as outlined.

## 2011-07-07 NOTE — Assessment & Plan Note (Signed)
Plan repeat echocardiogram to reassess LV function. We will also be able to see her proximal aortic root as she does have a family history of thoracic aortic aneurysm.

## 2011-07-07 NOTE — Patient Instructions (Signed)
Your physician has requested that you have an echocardiogram. Echocardiography is a painless test that uses sound waves to create images of your heart. It provides your doctor with information about the size and shape of your heart and how well your heart's chambers and valves are working. This procedure takes approximately one hour. There are no restrictions for this procedure.   

## 2011-07-07 NOTE — Progress Notes (Signed)
   HPI: Mrs. Gores  pleasant female who has a family history of cardiomyopathy, dilated aorta and bicuspid aortic valve.  An echocardiogram performed on May 31, 2006 showed normal LV function.  Her aortic root apparently was not dilated and the aortic valve was not bicuspid. Cardiac MRI 12/08 revealed no evidence of aortic aneurysm.  Ascending aorta measured 27 mm. The descending aorta measured 15 mm. No evidence of cardiomyopathy.  Quantitative ejection fraction 59% with normal LV cavity size. The aortic valve did not appear bicuspid or morphologically abnormal. Last seen 5/11. Since then, the patient denies any dyspnea on exertion, orthopnea, PND, pedal edema, palpitations, syncope or chest pain.  Current Outpatient Prescriptions  Medication Sig Dispense Refill  . hydrocortisone (ANUSOL-HC) 25 MG suppository Place 25 mg rectally as needed.      . hyoscyamine (LEVSIN SL) 0.125 MG SL tablet Place 0.125 mg under the tongue every 4 (four) hours as needed.      Marland Kitchen levonorgestrel (MIRENA) 20 MCG/24HR IUD 1 each by Intrauterine route once.      . pantoprazole (PROTONIX) 40 MG tablet Take 40 mg by mouth daily.         Past Medical History  Diagnosis Date  . Anal fissure   . Melasma   . Hemorrhoids   . Nevus     Past Surgical History  Procedure Date  . Wisdom tooth extraction   . Cholecystectomy     History   Social History  . Marital Status: Married    Spouse Name: N/A    Number of Children: N/A  . Years of Education: N/A   Occupational History  . Not on file.   Social History Main Topics  . Smoking status: Former Smoker    Quit date: 02/09/2007  . Smokeless tobacco: Not on file  . Alcohol Use: Yes  . Drug Use: No  . Sexually Active: Not on file   Other Topics Concern  . Not on file   Social History Narrative  . No narrative on file    ROS: no fevers or chills, productive cough, hemoptysis, dysphasia, odynophagia, melena, hematochezia, dysuria, hematuria, rash, seizure  activity, orthopnea, PND, pedal edema, claudication. Remaining systems are negative.  Physical Exam: Well-developed well-nourished in no acute distress.  Skin is warm and dry.  HEENT is normal.  Neck is supple.  Chest is clear to auscultation with normal expansion.  Cardiovascular exam is regular rate and rhythm. No murmur Abdominal exam nontender or distended. No masses palpated. Extremities show no edema. neuro grossly intact  ECG sinus rhythm at a rate of 82. Right axis deviation. RV conduction delay.

## 2011-07-09 ENCOUNTER — Telehealth: Payer: Self-pay | Admitting: *Deleted

## 2011-07-09 NOTE — Telephone Encounter (Signed)
Spoke with patient today to schedule Echocardiogram. [name] will call back to schedule after her husband checks his work schedule.

## 2011-09-13 ENCOUNTER — Other Ambulatory Visit: Payer: Self-pay | Admitting: Obstetrics and Gynecology

## 2011-09-13 DIAGNOSIS — R921 Mammographic calcification found on diagnostic imaging of breast: Secondary | ICD-10-CM

## 2011-09-22 ENCOUNTER — Ambulatory Visit (HOSPITAL_COMMUNITY): Payer: 59 | Attending: Cardiovascular Disease | Admitting: Radiology

## 2011-09-22 ENCOUNTER — Ambulatory Visit
Admission: RE | Admit: 2011-09-22 | Discharge: 2011-09-22 | Disposition: A | Discharge status: Home / Self Care | Payer: 59 | Source: Ambulatory Visit | Attending: Obstetrics and Gynecology | Admitting: Obstetrics and Gynecology

## 2011-09-22 DIAGNOSIS — R921 Mammographic calcification found on diagnostic imaging of breast: Secondary | ICD-10-CM

## 2011-09-22 DIAGNOSIS — Z8249 Family history of ischemic heart disease and other diseases of the circulatory system: Secondary | ICD-10-CM | POA: Insufficient documentation

## 2011-09-22 NOTE — Progress Notes (Signed)
Echocardiogram performed.  

## 2011-09-24 ENCOUNTER — Telehealth: Payer: Self-pay | Admitting: *Deleted

## 2011-09-24 NOTE — Telephone Encounter (Signed)
Left message on voicemail (identifed her) and to call back if any questions

## 2011-09-24 NOTE — Telephone Encounter (Signed)
Message copied by Burnell Blanks on Fri Sep 24, 2011  1:33 PM ------      Message from: Lewayne Bunting      Created: Thu Sep 23, 2011  5:47 AM       Hazle Coca

## 2014-07-22 ENCOUNTER — Other Ambulatory Visit: Payer: Self-pay | Admitting: Family Medicine

## 2014-07-22 ENCOUNTER — Other Ambulatory Visit: Payer: Self-pay | Admitting: Obstetrics and Gynecology

## 2014-07-22 DIAGNOSIS — N6011 Diffuse cystic mastopathy of right breast: Secondary | ICD-10-CM

## 2014-08-29 ENCOUNTER — Other Ambulatory Visit: Payer: Self-pay | Admitting: Obstetrics and Gynecology

## 2014-08-29 DIAGNOSIS — N6011 Diffuse cystic mastopathy of right breast: Secondary | ICD-10-CM

## 2014-08-30 ENCOUNTER — Other Ambulatory Visit: Payer: Self-pay | Admitting: Obstetrics and Gynecology

## 2014-08-30 ENCOUNTER — Ambulatory Visit
Admission: RE | Admit: 2014-08-30 | Discharge: 2014-08-30 | Disposition: A | Discharge status: Home / Self Care | Payer: 59 | Source: Ambulatory Visit | Attending: Obstetrics and Gynecology | Admitting: Obstetrics and Gynecology

## 2014-08-30 DIAGNOSIS — N6011 Diffuse cystic mastopathy of right breast: Secondary | ICD-10-CM

## 2019-04-08 ENCOUNTER — Ambulatory Visit: Payer: 59 | Attending: Internal Medicine

## 2019-04-08 DIAGNOSIS — Z23 Encounter for immunization: Secondary | ICD-10-CM

## 2019-04-08 NOTE — Progress Notes (Signed)
   Covid-19 Vaccination Clinic  Name:  [name]    MRN: [id number] DOB: 12-Feb-1980  04/08/2019  [name] was observed post Covid-19 immunization for 15 minutes without incidence. She was provided with Vaccine Information Sheet and instruction to access the V-Safe system.   [name] was instructed to call 911 with any severe reactions post vaccine: Marland Kitchen Difficulty breathing  . Swelling of your face and throat  . A fast heartbeat  . A bad rash all over your body  . Dizziness and weakness    Immunizations Administered    Name Date Dose VIS Date Route   Pfizer COVID-19 Vaccine 04/08/2019  1:16 PM 0.3 mL 01/19/2019 Intramuscular   Manufacturer: [place]   Lot: [id number]   [place]: [id number]

## 2019-05-01 ENCOUNTER — Ambulatory Visit: Payer: 59 | Attending: Internal Medicine

## 2019-05-01 DIAGNOSIS — Z23 Encounter for immunization: Secondary | ICD-10-CM

## 2019-05-01 NOTE — Progress Notes (Signed)
   Covid-19 Vaccination Clinic  Name:  [name]    MRN: [id number] DOB: 10-25-1980  05/01/2019  [name] was observed post Covid-19 immunization for 15 minutes without incident. She was provided with Vaccine Information Sheet and instruction to access the V-Safe system.   [name] was instructed to call 911 with any severe reactions post vaccine: Marland Kitchen Difficulty breathing  . Swelling of face and throat  . A fast heartbeat  . A bad rash all over body  . Dizziness and weakness   Immunizations Administered    Name Date Dose VIS Date Route   Pfizer COVID-19 Vaccine 05/01/2019  2:12 PM 0.3 mL 01/19/2019 Intramuscular   Manufacturer: [place]   Lot: [id number]   [place]: [id number]

## 2019-06-11 ENCOUNTER — Other Ambulatory Visit: Payer: Self-pay | Admitting: Obstetrics and Gynecology

## 2019-06-11 DIAGNOSIS — Z1231 Encounter for screening mammogram for malignant neoplasm of breast: Secondary | ICD-10-CM

## 2019-06-13 ENCOUNTER — Other Ambulatory Visit: Payer: Self-pay

## 2019-06-13 ENCOUNTER — Ambulatory Visit
Admission: RE | Admit: 2019-06-13 | Discharge: 2019-06-13 | Disposition: A | Discharge status: Home / Self Care | Payer: 59 | Source: Ambulatory Visit | Attending: Obstetrics and Gynecology | Admitting: Obstetrics and Gynecology

## 2019-06-13 DIAGNOSIS — Z1231 Encounter for screening mammogram for malignant neoplasm of breast: Secondary | ICD-10-CM

## 2019-11-01 ENCOUNTER — Other Ambulatory Visit: Payer: Self-pay

## 2019-11-01 ENCOUNTER — Encounter: Payer: Self-pay | Admitting: Medical-Surgical

## 2019-11-01 ENCOUNTER — Ambulatory Visit (INDEPENDENT_AMBULATORY_CARE_PROVIDER_SITE_OTHER): Payer: 59 | Admitting: Medical-Surgical

## 2019-11-01 VITALS — BP 111/71 | HR 86 | Temp 98.6°F | Ht 64.0 in | Wt 142.7 lb

## 2019-11-01 DIAGNOSIS — Z114 Encounter for screening for human immunodeficiency virus [HIV]: Secondary | ICD-10-CM | POA: Diagnosis not present

## 2019-11-01 DIAGNOSIS — Z7689 Persons encountering health services in other specified circumstances: Secondary | ICD-10-CM

## 2019-11-01 DIAGNOSIS — R222 Localized swelling, mass and lump, trunk: Secondary | ICD-10-CM

## 2019-11-01 DIAGNOSIS — Z1159 Encounter for screening for other viral diseases: Secondary | ICD-10-CM | POA: Diagnosis not present

## 2019-11-01 NOTE — Progress Notes (Signed)
New Patient Office Visit  Subjective:  Patient ID: [name], female    DOB: April 23, 1980  Age: 39 y.o. MRN: [id number]  CC:  Chief Complaint  Patient presents with  . Establish Care  . Mass    right clavical area, first noticed 4 days ago, denies pain    HPI [name] presents to re-establish care.   Noticed a swollen lump just over her right clavicle about 4 days ago. Unsure if it has been there longer than that. It is not painful. She notes it is movable in that area. No other symptoms including fever, chills, shortness of breath, cough, wheezing, chest pain, abdominal pain, or nausea/vomiting/diarrhea. Denies night sweats, weight loss. Endorses some constipation related to poor diet last week and reduced fluid intake. Treating that at home with OTC medications. She is a former smoker, quit in 2009.  Past Medical History:  Diagnosis Date  . Anal fissure   . Hemorrhoids   . Melasma   . Nevus     Past Surgical History:  Procedure Laterality Date  . CHOLECYSTECTOMY    . WISDOM TOOTH EXTRACTION      Family History  Problem Relation Age of Onset  . Cardiomyopathy Father   . Colitis Father   . Breast cancer Maternal Grandmother     Social History   Socioeconomic History  . Marital status: Married    Spouse name: Not on file  . Number of children: Not on file  . Years of education: Not on file  . Highest education level: Not on file  Occupational History  . Not on file  Tobacco Use  . Smoking status: Former Smoker    Quit date: 02/09/2007    Years since quitting: 12.7  . Smokeless tobacco: Never Used  Vaping Use  . Vaping Use: Never used  Substance and Sexual Activity  . Alcohol use: Yes    Alcohol/week: 3.0 standard drinks    Types: 3 Standard drinks or equivalent per week  . Drug use: Never  . Sexual activity: Yes    Partners: Male    Birth control/protection: Surgical    Comment: vasectomy  Other Topics Concern  . Not on file  Social History  Narrative  . Not on file   Social Determinants of Health   Financial Resource Strain:   . Difficulty of Paying Living Expenses: Not on file  Food Insecurity:   . Worried About Charity fundraiser in the Last Year: Not on file  . Ran Out of Food in the Last Year: Not on file  Transportation Needs:   . Lack of Transportation (Medical): Not on file  . Lack of Transportation (Non-Medical): Not on file  Physical Activity:   . Days of Exercise per Week: Not on file  . Minutes of Exercise per Session: Not on file  Stress:   . Feeling of Stress : Not on file  Social Connections:   . Frequency of Communication with Friends and Family: Not on file  . Frequency of Social Gatherings with Friends and Family: Not on file  . Attends Religious Services: Not on file  . Active Member of Clubs or Organizations: Not on file  . Attends Archivist Meetings: Not on file  . Marital Status: Not on file  Intimate Partner Violence:   . Fear of Current or Ex-Partner: Not on file  . Emotionally Abused: Not on file  . Physically Abused: Not on file  . Sexually Abused: Not  on file    ROS Review of Systems  Constitutional: Negative for chills, fatigue, fever and unexpected weight change.  Respiratory: Negative for cough, chest tightness, shortness of breath and wheezing.   Cardiovascular: Negative for chest pain, palpitations and leg swelling.  Genitourinary: Negative for dysuria, frequency, hematuria and urgency.  Neurological: Negative for dizziness, light-headedness and headaches.  Hematological: Positive for adenopathy (right supraclavicular).  Psychiatric/Behavioral: Negative for dysphoric mood and sleep disturbance. The patient is not nervous/anxious.     Objective:   Today's Vitals: BP 111/71   Pulse 86   Temp 98.6 F (37 C) (Oral)   Ht 5\' 4"  (1.626 m)   Wt 142 lb 11.2 oz (64.7 kg)   SpO2 98%   BMI 24.49 kg/m   Physical Exam Vitals reviewed.  Constitutional:      General:  She is not in acute distress.    Appearance: Normal appearance.  HENT:     Head: Normocephalic and atraumatic.  Cardiovascular:     Rate and Rhythm: Normal rate and regular rhythm.     Pulses: Normal pulses.     Heart sounds: Normal heart sounds. No murmur heard.  No friction rub. No gallop.   Pulmonary:     Effort: Pulmonary effort is normal. No respiratory distress.     Breath sounds: Normal breath sounds. No wheezing.  Lymphadenopathy:     Upper Body:     Right upper body: Supraclavicular adenopathy present.     Left upper body: No supraclavicular adenopathy.  Skin:    General: Skin is warm and dry.  Neurological:     Mental Status: She is alert and oriented to person, place, and time.  Psychiatric:        Mood and Affect: Mood normal.        Behavior: Behavior normal.        Thought Content: Thought content normal.        Judgment: Judgment normal.     Assessment & Plan:   1. Encounter to establish care Reviewed available information and discussed care concerns with patient.   2. Supraclavicular mass Suspect mass is an enlarged lymph node. Doesn't feel like a lipoma. Getting an ultrasound of the right supraclavicular area. Checking CBC with differential.  - US SOFT TISSUE NECK; Future - CBC w/Diff/Platelet  3. Encounter for screening for HIV Discussed screening recommendations. Patient agreeable so adding to blood work today. - HIV Antibody (routine testing w rflx)  4. Need for hepatitis C screening test Discussed screening recommendations. Patient agreeable so adding to blood work today. - Hepatitis C antibody  Outpatient Encounter Medications as of 11/01/2019  Medication Sig  . [DISCONTINUED] hydrocortisone (ANUSOL-HC) 25 MG suppository Place 25 mg rectally as needed. (Patient not taking: Reported on 11/01/2019)  . [DISCONTINUED] hyoscyamine (LEVSIN SL) 0.125 MG SL tablet Place 0.125 mg under the tongue every 4 (four) hours as needed. (Patient not taking: Reported  on 11/01/2019)  . [DISCONTINUED] levonorgestrel (MIRENA) 20 MCG/24HR IUD 1 each by Intrauterine route once. (Patient not taking: Reported on 11/01/2019)  . [DISCONTINUED] pantoprazole (PROTONIX) 40 MG tablet Take 40 mg by mouth daily. (Patient not taking: Reported on 11/01/2019)   No facility-administered encounter medications on file as of 11/01/2019.    Follow-up: Return if symptoms worsen or fail to improve.   Clearnce Sorrel, DNP, APRN, FNP-BC [facility]

## 2019-11-02 ENCOUNTER — Ambulatory Visit (INDEPENDENT_AMBULATORY_CARE_PROVIDER_SITE_OTHER): Payer: 59

## 2019-11-02 DIAGNOSIS — R222 Localized swelling, mass and lump, trunk: Secondary | ICD-10-CM | POA: Diagnosis not present

## 2019-11-02 LAB — CBC WITH DIFFERENTIAL/PLATELET
Absolute Monocytes: 618 cells/uL (ref 200–950)
Basophils Absolute: 29 cells/uL (ref 0–200)
Basophils Relative: 0.3 %
Eosinophils Absolute: 86 cells/uL (ref 15–500)
Eosinophils Relative: 0.9 %
HCT: 44.3 % (ref 35.0–45.0)
Hemoglobin: 14.8 g/dL (ref 11.7–15.5)
Lymphs Abs: 2632 cells/uL (ref 850–3900)
MCH: 30.5 pg (ref 27.0–33.0)
MCHC: 33.4 g/dL (ref 32.0–36.0)
MCV: 91.3 fL (ref 80.0–100.0)
MPV: 10.3 fL (ref 7.5–12.5)
Monocytes Relative: 6.5 %
Neutro Abs: 6137 cells/uL (ref 1500–7800)
Neutrophils Relative %: 64.6 %
Platelets: 213 10*3/uL (ref 140–400)
RBC: 4.85 10*6/uL (ref 3.80–5.10)
RDW: 10.9 % — ABNORMAL LOW (ref 11.0–15.0)
Total Lymphocyte: 27.7 %
WBC: 9.5 10*3/uL (ref 3.8–10.8)

## 2019-11-02 LAB — HEPATITIS C ANTIBODY
Hepatitis C Ab: NONREACTIVE
SIGNAL TO CUT-OFF: 0.03 (ref <1.00)

## 2019-11-02 LAB — HIV ANTIBODY (ROUTINE TESTING W REFLEX): HIV 1&2 Ab, 4th Generation: NONREACTIVE

## 2020-05-12 ENCOUNTER — Other Ambulatory Visit: Payer: Self-pay | Admitting: Obstetrics and Gynecology

## 2020-05-15 ENCOUNTER — Other Ambulatory Visit: Payer: Self-pay | Admitting: Obstetrics and Gynecology

## 2020-05-15 DIAGNOSIS — N644 Mastodynia: Secondary | ICD-10-CM

## 2020-05-20 ENCOUNTER — Ambulatory Visit
Admission: RE | Admit: 2020-05-20 | Discharge: 2020-05-20 | Disposition: A | Discharge status: Home / Self Care | Payer: 59 | Source: Ambulatory Visit | Attending: Obstetrics and Gynecology | Admitting: Obstetrics and Gynecology

## 2020-05-20 ENCOUNTER — Other Ambulatory Visit: Payer: Self-pay

## 2020-05-20 ENCOUNTER — Ambulatory Visit: Payer: 59

## 2020-05-20 DIAGNOSIS — N644 Mastodynia: Secondary | ICD-10-CM

## 2020-12-12 ENCOUNTER — Ambulatory Visit (INDEPENDENT_AMBULATORY_CARE_PROVIDER_SITE_OTHER): Payer: BC Managed Care – PPO | Admitting: Family Medicine

## 2020-12-12 ENCOUNTER — Encounter: Payer: Self-pay | Admitting: Family Medicine

## 2020-12-12 VITALS — BP 103/64 | HR 71 | Temp 97.7°F | Wt 145.1 lb

## 2020-12-12 DIAGNOSIS — Z13220 Encounter for screening for lipoid disorders: Secondary | ICD-10-CM | POA: Diagnosis not present

## 2020-12-12 DIAGNOSIS — Z0000 Encounter for general adult medical examination without abnormal findings: Secondary | ICD-10-CM | POA: Diagnosis not present

## 2020-12-12 NOTE — Progress Notes (Signed)
BP 103/64 (BP Location: Left Arm, Patient Position: Sitting, Cuff Size: Normal)   Pulse 71   Temp 97.7 F (36.5 C) (Oral)   Wt 145 lb 1.9 oz (65.8 kg)   BMI 24.91 kg/m    Subjective:    Patient ID: [name], female    DOB: 11-Jan-1981, 40 y.o.   MRN: [id number]  HPI: [name] is a 40 y.o. female presenting on 12/12/2020 for comprehensive medical examination. 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 - max 3-4 servings per week She denies nictoine use. She denies illegal substance use.    She reports regular menstrual periods with light flow for a few days. Current menopausal symptoms: no She is currently  sexually active with husband She denies  concerns today about STI Contraception choices are: husband with vasectomy   She denies concerns about skin changes today. She denies concerns about bowel changes today. She denies concerns about bladder changes today.   Depression Screen done today and results listed below:  Depression screen [facility] 2/9 12/12/2020 11/01/2019  Decreased Interest 0 0  Down, Depressed, Hopeless 0 0  PHQ - 2 Score 0 0    She does not have a history of falls.        Past Medical History:  Past Medical History:  Diagnosis Date   Anal fissure    Hemorrhoids    Melasma    Nevus     Surgical History:  Past Surgical History:  Procedure Laterality Date   CHOLECYSTECTOMY     WISDOM TOOTH EXTRACTION      Medications:  No current outpatient medications on file prior to visit.   No current facility-administered medications on file prior to visit.    Allergies:   No Known Allergies  Social History:  Social History   Socioeconomic History   Marital status: Married    Spouse name: Not on file   Number of children: Not on file   Years of education: Not on file   Highest education level: Not on file  Occupational History   Not on file  Tobacco Use   Smoking status: Former    Types: Cigarettes    Quit date: 02/09/2007    Years since quitting: 13.8   Smokeless tobacco: Never  Vaping Use   Vaping Use: Never used  Substance and Sexual Activity   Alcohol use: Yes    Alcohol/week: 3.0 standard drinks    Types: 3 Standard drinks or equivalent per week   Drug use: Never   Sexual activity: Yes    Partners: Male    Birth control/protection: Surgical    Comment: vasectomy  Other Topics Concern   Not on file  Social History Narrative   Not on file   Social Determinants of Health   Financial Resource Strain: Not on file  Food Insecurity: Not on file  Transportation Needs: Not on file  Physical Activity: Not on file  Stress: Not on file  Social Connections: Not on file  Intimate Partner Violence: Not on file   Social History   Tobacco Use  Smoking Status Former  Types: Cigarettes   Quit date: 02/09/2007   Years since quitting: 13.8  Smokeless Tobacco Never   Social History   Substance and Sexual Activity  Alcohol Use Yes   Alcohol/week: 3.0 standard drinks   Types: 3 Standard drinks or equivalent per week    Family History:  Family History  Problem Relation Age of Onset   Cardiomyopathy Father    Colitis Father    Breast cancer Maternal Grandmother     Past medical history, surgical history, medications, allergies, family history and social history reviewed with patient today and changes made to appropriate areas of the chart.   All ROS negative except what is listed above and in the HPI.      Objective:    BP 103/64 (BP Location: Left Arm, Patient Position: Sitting, Cuff Size: Normal)   Pulse 71   Temp 97.7 F  (36.5 C) (Oral)   Wt 145 lb 1.9 oz (65.8 kg)   BMI 24.91 kg/m   Wt Readings from Last 3 Encounters:  12/12/20 145 lb 1.9 oz (65.8 kg)  11/01/19 142 lb 11.2 oz (64.7 kg)  07/07/11 149 lb (67.6 kg)    Physical Exam Vitals and nursing note reviewed.  Constitutional:      General: She is not in acute distress.    Appearance: Normal appearance. She is normal weight.  HENT:     Head: Normocephalic and atraumatic.     Right Ear: Hearing, tympanic membrane, ear canal and external ear normal.     Left Ear: Hearing, tympanic membrane, ear canal and external ear normal.     Nose: Nose normal. No mucosal edema, congestion or rhinorrhea.     Right Sinus: No maxillary sinus tenderness or frontal sinus tenderness.     Left Sinus: No maxillary sinus tenderness or frontal sinus tenderness.     Mouth/Throat:     Lips: Pink.     Mouth: Mucous membranes are moist.     Tongue: No lesions.     Pharynx: Oropharynx is clear. Uvula midline. No oropharyngeal exudate or posterior oropharyngeal erythema.     Tonsils: No tonsillar exudate or tonsillar abscesses.  Eyes:     General: Lids are normal. Vision grossly intact.     Extraocular Movements: Extraocular movements intact.     Conjunctiva/sclera: Conjunctivae normal.     Pupils: Pupils are equal, round, and reactive to light.     Funduscopic exam:    Right eye: Red reflex present.        Left eye: Red reflex present.    Visual Fields: Right eye visual fields normal and left eye visual fields normal.  Neck:     Thyroid: No thyroid mass, thyromegaly or thyroid tenderness.     Vascular: No carotid bruit or JVD.     Trachea: Trachea normal.  Cardiovascular:     Rate and Rhythm: Normal rate and regular rhythm.     Chest Wall: PMI is not displaced.     Pulses: Normal pulses.     Heart sounds: Normal heart sounds. No murmur heard. Pulmonary:     Effort: Pulmonary effort is normal. No respiratory distress.     Breath sounds: Normal breath sounds.   Chest:     Chest wall: No deformity.  Breasts:    Breasts are symmetrical.  Abdominal:     General: Abdomen is flat. Bowel sounds are normal. There is no distension.     Palpations: Abdomen is soft. There is no hepatomegaly, splenomegaly or mass.  Tenderness: There is no abdominal tenderness. There is no right CVA tenderness, left CVA tenderness, guarding or rebound.  Genitourinary:    Vagina: Normal. No tenderness or lesions.     Cervix: No cervical motion tenderness, discharge, friability or lesion.     Uterus: Normal. Not tender.      Adnexa: Right adnexa normal and left adnexa normal.       Right: No mass or tenderness.         Left: No mass or tenderness.    Musculoskeletal:        General: No swelling, tenderness, deformity or signs of injury. Normal range of motion.     Right shoulder: Normal.     Left shoulder: Normal.     Right upper arm: Normal.     Left upper arm: Normal.     Cervical back: Normal, full passive range of motion without pain, normal range of motion and neck supple. No tenderness.     Thoracic back: Normal.     Lumbar back: Normal.     Right hip: Normal.     Left hip: Normal.     Right upper leg: Normal.     Left upper leg: Normal.     Right knee: Normal.     Left knee: Normal.     Right lower leg: No edema.     Left lower leg: No edema.  Feet:     Right foot:     Skin integrity: Skin integrity normal.     Toenail Condition: Right toenails are normal.     Left foot:     Skin integrity: Skin integrity normal.     Toenail Condition: Left toenails are normal.  Lymphadenopathy:     Head:     Right side of head: No submental or tonsillar adenopathy.     Left side of head: No submental or tonsillar adenopathy.     Cervical: No cervical adenopathy.     Right cervical: No superficial cervical adenopathy.    Left cervical: No superficial cervical adenopathy.     Upper Body:     Right upper body: No supraclavicular adenopathy.     Left upper body:  No supraclavicular adenopathy.  Skin:    General: Skin is warm and dry.     Capillary Refill: Capillary refill takes less than 2 seconds.     Findings: No bruising or rash.     Nails: There is no clubbing.  Neurological:     General: No focal deficit present.     Mental Status: She is alert and oriented to person, place, and time.     Sensory: Sensation is intact.     Motor: Motor function is intact.     Coordination: Coordination is intact.     Gait: Gait is intact.  Psychiatric:        Attention and Perception: Attention normal.        Mood and Affect: Mood and affect normal.        Speech: Speech normal.        Behavior: Behavior normal. Behavior is cooperative.        Thought Content: Thought content normal.        Cognition and Memory: Cognition and memory normal.        Judgment: Judgment normal.    Results for orders placed or performed in visit on 11/01/19  Hepatitis C antibody  Result Value Ref Range   Hepatitis C Ab NON-REACTIVE NON-REACTI   SIGNAL  TO CUT-OFF 0.03 <1.00  HIV Antibody (routine testing w rflx)  Result Value Ref Range   HIV 1&2 Ab, 4th Generation NON-REACTIVE NON-REACTI  CBC w/Diff/Platelet  Result Value Ref Range   WBC 9.5 3.8 - 10.8 Thousand/uL   RBC 4.85 3.80 - 5.10 Million/uL   Hemoglobin 14.8 11.7 - 15.5 g/dL   HCT 44.3 35.0 - 45.0 %   MCV 91.3 80.0 - 100.0 fL   MCH 30.5 27.0 - 33.0 pg   MCHC 33.4 32.0 - 36.0 g/dL   RDW 10.9 (L) 11.0 - 15.0 %   Platelets 213 140 - 400 Thousand/uL   MPV 10.3 7.5 - 12.5 fL   Neutro Abs 6,137 1,500 - 7,800 cells/uL   Lymphs Abs 2,632 850 - 3,900 cells/uL   Absolute Monocytes 618 200 - 950 cells/uL   Eosinophils Absolute 86 15 - 500 cells/uL   Basophils Absolute 29 0 - 200 cells/uL   Neutrophils Relative % 64.6 %   Total Lymphocyte 27.7 %   Monocytes Relative 6.5 %   Eosinophils Relative 0.9 %   Basophils Relative 0.3 %      Assessment & Plan:   Problem List Items Addressed This Visit   None Visit  Diagnoses     Annual physical exam    -  Primary   Relevant Orders   CBC with Differential/Platelet   Comprehensive metabolic panel   Lipid panel   TSH   Screening, lipid       Relevant Orders   Lipid panel          LABORATORY TESTING:  - Health maintenance labs ordered today as discussed above.           CBC, CMP, LIPIDS          TSH           - STI testing: deferred (USPSTF recommends Gonorrhea/Chlamydia screening (urine) in all sexually active females (or on OCP) age 56 and under; continue after age 41 if high risk) - Pap smear: up to date     IMMUNIZATIONS:   - Tdap: Tetanus vaccination status reviewed: last tetanus booster within 10 years. - Influenza: Given elsewhere - Pneumovax: Not applicable - Prevnar: Not applicable - HPV: Not applicable - Shingrix vaccine: Not applicable - [id number]-[id number]: Not applicable  SCREENING: - Mammogram: Up to date - Bone Density: Not applicable - Colonoscopy: Not applicable  Discussed with patient purpose of the colonoscopy is to detect colon cancer at curable precancerous or early stages  - AAA Screening: Not applicable  -Hearing Test: Not applicable  -Spirometry: Not applicable  - Lung Cancer Screening: Not applicable    PATIENT COUNSELING:   Advised to take 1 mg of folate supplement per day if capable of pregnancy.   Sexuality: Discussed sexually transmitted diseases, partner selection, use of condoms, avoidance of unintended pregnancy, and contraceptive alternatives.    I discussed with the patient that most people either abstain from alcohol or drink within safe limits (<=14/week and <=4 drinks/occasion for males, <=7/weeks and <= 3 drinks/occasion for females) and that the risk for alcohol disorders and other health effects rises proportionally with the number of drinks per week and how often a drinker exceeds daily limits.  Discussed cessation/primary prevention of drug use and availability of treatment for abuse.   Diet:  Encouraged to adjust caloric intake to maintain or achieve ideal body weight, to reduce intake of dietary saturated fat and total fat, to limit sodium intake by avoiding high sodium foods  and not adding table salt, and to maintain adequate dietary potassium and calcium preferably from fresh fruits, vegetables, and low-fat dairy products. Encouraged vitamin D 1000 units and Calcium 1300mg  or 4 servings of dairy a day.  Emphasized the importance of regular exercise.  Injury prevention: Discussed safety belts, safety helmets, smoke detector, smoking near bedding or upholstery.   Dental health: Discussed importance of regular tooth brushing, flossing, and dental visits.  Follow up plan:  Return in about 1 year (around 12/12/2021).   Purcell Nails Olevia Bowens, DNP, FNP-C

## 2020-12-13 LAB — CBC WITH DIFFERENTIAL/PLATELET
Absolute Monocytes: 468 cells/uL (ref 200–950)
Basophils Absolute: 21 cells/uL (ref 0–200)
Basophils Relative: 0.4 %
Eosinophils Absolute: 140 cells/uL (ref 15–500)
Eosinophils Relative: 2.7 %
HCT: 42.7 % (ref 35.0–45.0)
Hemoglobin: 14.2 g/dL (ref 11.7–15.5)
Lymphs Abs: 1622 cells/uL (ref 850–3900)
MCH: 30 pg (ref 27.0–33.0)
MCHC: 33.3 g/dL (ref 32.0–36.0)
MCV: 90.1 fL (ref 80.0–100.0)
MPV: 10.2 fL (ref 7.5–12.5)
Monocytes Relative: 9 %
Neutro Abs: 2948 cells/uL (ref 1500–7800)
Neutrophils Relative %: 56.7 %
Platelets: 183 10*3/uL (ref 140–400)
RBC: 4.74 10*6/uL (ref 3.80–5.10)
RDW: 10.9 % — ABNORMAL LOW (ref 11.0–15.0)
Total Lymphocyte: 31.2 %
WBC: 5.2 10*3/uL (ref 3.8–10.8)

## 2020-12-13 LAB — LIPID PANEL
Cholesterol: 168 mg/dL (ref <200)
HDL: 78 mg/dL (ref >=50)
LDL Cholesterol (Calc): 77 mg/dL (calc)
Non-HDL Cholesterol (Calc): 90 mg/dL (calc) (ref <130)
Total CHOL/HDL Ratio: 2.2 (calc) (ref <5.0)
Triglycerides: 44 mg/dL (ref <150)

## 2020-12-13 LAB — TSH: TSH: 1.14 mIU/L

## 2020-12-13 LAB — COMPREHENSIVE METABOLIC PANEL
AG Ratio: 2 (calc) (ref 1.0–2.5)
ALT: 15 U/L (ref 6–29)
AST: 18 U/L (ref 10–30)
Albumin: 4.3 g/dL (ref 3.6–5.1)
Alkaline phosphatase (APISO): 27 U/L — ABNORMAL LOW (ref 31–125)
BUN: 14 mg/dL (ref 7–25)
CO2: 28 mmol/L (ref 20–32)
Calcium: 8.9 mg/dL (ref 8.6–10.2)
Chloride: 106 mmol/L (ref 98–110)
Creat: 0.83 mg/dL (ref 0.50–0.99)
Globulin: 2.1 g/dL (calc) (ref 1.9–3.7)
Glucose, Bld: 85 mg/dL (ref 65–99)
Potassium: 4.2 mmol/L (ref 3.5–5.3)
Sodium: 139 mmol/L (ref 135–146)
Total Bilirubin: 1.1 mg/dL (ref 0.2–1.2)
Total Protein: 6.4 g/dL (ref 6.1–8.1)

## 2021-03-30 DIAGNOSIS — Z803 Family history of malignant neoplasm of breast: Secondary | ICD-10-CM | POA: Diagnosis not present

## 2021-03-30 DIAGNOSIS — N644 Mastodynia: Secondary | ICD-10-CM | POA: Diagnosis not present

## 2021-04-21 DIAGNOSIS — R922 Inconclusive mammogram: Secondary | ICD-10-CM | POA: Diagnosis not present

## 2021-04-21 DIAGNOSIS — N644 Mastodynia: Secondary | ICD-10-CM | POA: Diagnosis not present

## 2021-10-05 DIAGNOSIS — L308 Other specified dermatitis: Secondary | ICD-10-CM | POA: Diagnosis not present

## 2021-10-05 DIAGNOSIS — D3611 Benign neoplasm of peripheral nerves and autonomic nervous system of face, head, and neck: Secondary | ICD-10-CM | POA: Diagnosis not present

## 2021-10-05 DIAGNOSIS — D225 Melanocytic nevi of trunk: Secondary | ICD-10-CM | POA: Diagnosis not present

## 2021-10-05 DIAGNOSIS — D2262 Melanocytic nevi of left upper limb, including shoulder: Secondary | ICD-10-CM | POA: Diagnosis not present

## 2021-10-05 DIAGNOSIS — D2261 Melanocytic nevi of right upper limb, including shoulder: Secondary | ICD-10-CM | POA: Diagnosis not present

## 2021-10-05 DIAGNOSIS — D485 Neoplasm of uncertain behavior of skin: Secondary | ICD-10-CM | POA: Diagnosis not present

## 2022-04-13 DIAGNOSIS — Z124 Encounter for screening for malignant neoplasm of cervix: Secondary | ICD-10-CM | POA: Diagnosis not present

## 2022-04-13 DIAGNOSIS — Z01419 Encounter for gynecological examination (general) (routine) without abnormal findings: Secondary | ICD-10-CM | POA: Diagnosis not present

## 2022-04-13 LAB — HM PAP SMEAR

## 2022-04-29 DIAGNOSIS — Z1231 Encounter for screening mammogram for malignant neoplasm of breast: Secondary | ICD-10-CM | POA: Diagnosis not present

## 2022-05-05 DIAGNOSIS — R928 Other abnormal and inconclusive findings on diagnostic imaging of breast: Secondary | ICD-10-CM | POA: Diagnosis not present

## 2022-05-05 DIAGNOSIS — N6489 Other specified disorders of breast: Secondary | ICD-10-CM | POA: Diagnosis not present

## 2022-05-05 DIAGNOSIS — R922 Inconclusive mammogram: Secondary | ICD-10-CM | POA: Diagnosis not present

## 2022-11-03 DIAGNOSIS — D2261 Melanocytic nevi of right upper limb, including shoulder: Secondary | ICD-10-CM | POA: Diagnosis not present

## 2022-11-03 DIAGNOSIS — D2262 Melanocytic nevi of left upper limb, including shoulder: Secondary | ICD-10-CM | POA: Diagnosis not present

## 2022-11-03 DIAGNOSIS — L218 Other seborrheic dermatitis: Secondary | ICD-10-CM | POA: Diagnosis not present

## 2022-11-03 DIAGNOSIS — D225 Melanocytic nevi of trunk: Secondary | ICD-10-CM | POA: Diagnosis not present

## 2022-11-16 ENCOUNTER — Ambulatory Visit (INDEPENDENT_AMBULATORY_CARE_PROVIDER_SITE_OTHER): Payer: BC Managed Care – PPO | Admitting: Medical-Surgical

## 2022-11-16 VITALS — BP 97/64 | HR 76 | Resp 20 | Ht 64.0 in | Wt 155.9 lb

## 2022-11-16 DIAGNOSIS — Z13220 Encounter for screening for lipoid disorders: Secondary | ICD-10-CM

## 2022-11-16 DIAGNOSIS — Z0000 Encounter for general adult medical examination without abnormal findings: Secondary | ICD-10-CM | POA: Diagnosis not present

## 2022-11-16 NOTE — Progress Notes (Signed)
Complete physical exam  Patient: [name]   DOB: 11-06-1980   42 y.o. Female  MRN: [id number]  Subjective:    Chief Complaint  Patient presents with   Annual Exam   [name] is a 42 y.o. female who presents today for a complete physical exam. She reports consuming a general diet.  Some intermittent exercise doing weight lifting, treadmill, plays with her kids to stay active.  She generally feels well. She reports sleeping well. She does not have additional problems to discuss today.   Most recent fall risk assessment:    11/16/2022    2:13 PM  Fall Risk   Falls in the past year? 0  Number falls in past yr: 0  Injury with Fall? 0  Risk for fall due to : No Fall Risks  Follow up Falls evaluation completed     Most recent depression screenings:    11/16/2022    2:13 PM 12/12/2020    9:03 AM  PHQ 2/9 Scores  PHQ - 2 Score 0 0    Vision:Within last year, Dental: No current dental problems and Receives regular dental care, and STD: The patient denies history of sexually transmitted disease.    Patient Care Team: Christen Butter, NP as PCP - General (Nurse Practitioner)   No outpatient medications prior to visit.   No facility-administered medications prior to visit.    Review of Systems  Constitutional:  Negative for chills, fever, malaise/fatigue and weight loss.  HENT:  Negative for congestion, ear pain, hearing loss, sinus pain and sore throat.   Eyes:  Negative for blurred vision, photophobia and pain.  Respiratory:  Negative for cough, shortness of breath and wheezing.   Cardiovascular:  Negative for chest pain, palpitations and leg swelling.  Gastrointestinal:  Negative for abdominal pain, constipation, diarrhea, heartburn, nausea and vomiting.  Genitourinary:  Negative for dysuria, frequency and urgency.  Musculoskeletal:  Negative for falls and neck pain.  Skin:  Negative for itching and rash.  Neurological:  Negative for dizziness, weakness and  headaches.  Endo/Heme/Allergies:  Negative for polydipsia. Does not bruise/bleed easily.  Psychiatric/Behavioral:  Negative for depression, substance abuse and suicidal ideas. The patient is not nervous/anxious.      Objective:    BP 97/64 (BP Location: Left Arm, Cuff Size: Normal)   Pulse 76   Resp 20   Ht 5\' 4"  (1.626 m)   Wt 155 lb 14.4 oz (70.7 kg)   SpO2 96%   BMI 26.76 kg/m    Physical Exam Vitals reviewed.  Constitutional:      General: She is not in acute distress.    Appearance: Normal appearance. She is not ill-appearing.  HENT:     Head: Normocephalic and atraumatic.     Right Ear: Tympanic membrane, ear canal and external ear normal. There is no impacted cerumen.     Left Ear: Tympanic membrane, ear canal and external ear normal. There is no impacted cerumen.     Nose: Nose normal. No congestion or rhinorrhea.     Mouth/Throat:     Mouth: Mucous membranes are moist.     Pharynx: No oropharyngeal exudate or posterior oropharyngeal erythema.  Eyes:     General: No scleral icterus.       Right eye: No discharge.        Left eye: No discharge.     Extraocular Movements: Extraocular movements intact.     Conjunctiva/sclera: Conjunctivae normal.     Pupils: Pupils  are equal, round, and reactive to light.  Neck:     Thyroid: No thyromegaly.     Vascular: No carotid bruit or JVD.     Trachea: Trachea normal.  Cardiovascular:     Rate and Rhythm: Normal rate and regular rhythm.     Pulses: Normal pulses.     Heart sounds: Normal heart sounds. No murmur heard.    No friction rub. No gallop.  Pulmonary:     Effort: Pulmonary effort is normal. No respiratory distress.     Breath sounds: Normal breath sounds. No wheezing.  Abdominal:     General: Bowel sounds are normal. There is no distension.     Palpations: Abdomen is soft.     Tenderness: There is no abdominal tenderness. There is no guarding.  Musculoskeletal:        General: Normal range of motion.      Cervical back: Normal range of motion and neck supple.  Lymphadenopathy:     Cervical: No cervical adenopathy.  Skin:    General: Skin is warm and dry.  Neurological:     Mental Status: She is alert and oriented to person, place, and time.     Cranial Nerves: No cranial nerve deficit.  Psychiatric:        Mood and Affect: Mood normal.        Behavior: Behavior normal.        Thought Content: Thought content normal.        Judgment: Judgment normal.      No results found for any visits on 11/16/22.     Assessment & Plan:    Routine Health Maintenance and Physical Exam  Immunization History  Administered Date(s) Administered   Influenza-Unspecified 12/12/2013, 12/09/2020   PFIZER(Purple Top)SARS-COV-2 Vaccination 04/08/2019, 05/01/2019   Tdap 08/22/2012    Health Maintenance  Topic Date Due   DTaP/Tdap/Td (2 - Td or Tdap) 08/23/2022   COVID-19 Vaccine (3 - 2023-24 season) 12/02/2022 (Originally 10/10/2022)   INFLUENZA VACCINE  05/09/2023 (Originally 09/09/2022)   Cervical Cancer Screening (HPV/Pap Cotest)  04/12/2025   Hepatitis C Screening  Completed   HIV Screening  Completed   HPV VACCINES  Aged Out    Discussed health benefits of physical activity, and encouraged her to engage in regular exercise appropriate for her age and condition.  1. Annual physical exam Checking labs as below. UTD on preventative care. Wellness information provided with AVS. - CBC with Differential/Platelet - CMP14+EGFR  2. Lipid screening Lipids ordered today.  - Lipid panel  Return in about 1 year (around 11/16/2023) for annual physical exam or sooner if needed.   Christen Butter, NP

## 2022-11-16 NOTE — Patient Instructions (Signed)
Preventive Care 40-43 Years Old, Female Preventive care refers to lifestyle choices and visits with your health care provider that can promote health and wellness. Preventive care visits are also called wellness exams. What can I expect for my preventive care visit? Counseling Your health care provider may ask you questions about your: Medical history, including: Past medical problems. Family medical history. Pregnancy history. Current health, including: Menstrual cycle. Method of birth control. Emotional well-being. Home life and relationship well-being. Sexual activity and sexual health. Lifestyle, including: Alcohol, nicotine or tobacco, and drug use. Access to firearms. Diet, exercise, and sleep habits. Work and work environment. Sunscreen use. Safety issues such as seatbelt and bike helmet use. Physical exam Your health care provider will check your: Height and weight. These may be used to calculate your BMI (body mass index). BMI is a measurement that tells if you are at a healthy weight. Waist circumference. This measures the distance around your waistline. This measurement also tells if you are at a healthy weight and may help predict your risk of certain diseases, such as type 2 diabetes and high blood pressure. Heart rate and blood pressure. Body temperature. Skin for abnormal spots. What immunizations do I need?  Vaccines are usually given at various ages, according to a schedule. Your health care provider will recommend vaccines for you based on your age, medical history, and lifestyle or other factors, such as travel or where you work. What tests do I need? Screening Your health care provider may recommend screening tests for certain conditions. This may include: Lipid and cholesterol levels. Diabetes screening. This is done by checking your blood sugar (glucose) after you have not eaten for a while (fasting). Pelvic exam and Pap test. Hepatitis B test. Hepatitis C  test. HIV (human immunodeficiency virus) test. STI (sexually transmitted infection) testing, if you are at risk. Lung cancer screening. Colorectal cancer screening. Mammogram. Talk with your health care provider about when you should start having regular mammograms. This may depend on whether you have a family history of breast cancer. BRCA-related cancer screening. This may be done if you have a family history of breast, ovarian, tubal, or peritoneal cancers. Bone density scan. This is done to screen for osteoporosis. Talk with your health care provider about your test results, treatment options, and if necessary, the need for more tests. Follow these instructions at home: Eating and drinking  Eat a diet that includes fresh fruits and vegetables, whole grains, lean protein, and low-fat dairy products. Take vitamin and mineral supplements as recommended by your health care provider. Do not drink alcohol if: Your health care provider tells you not to drink. You are pregnant, may be pregnant, or are planning to become pregnant. If you drink alcohol: Limit how much you have to 0-1 drink a day. Know how much alcohol is in your drink. In the [place] one drink equals one 12 oz bottle of beer (355 mL), one 5 oz glass of wine (148 mL), or one 1 oz glass of hard liquor (44 mL). Lifestyle Brush your teeth every morning and night with fluoride toothpaste. Floss one time each day. Exercise for at least 30 minutes 5 or more days each week. Do not use any products that contain nicotine or tobacco. These products include cigarettes, chewing tobacco, and vaping devices, such as e-cigarettes. If you need help quitting, ask your health care provider. Do not use drugs. If you are sexually active, practice safe sex. Use a condom or other form of protection to   prevent STIs. If you do not wish to become pregnant, use a form of birth control. If you plan to become pregnant, see your health care provider for a  prepregnancy visit. Take aspirin only as told by your health care provider. Make sure that you understand how much to take and what form to take. Work with your health care provider to find out whether it is safe and beneficial for you to take aspirin daily. Find healthy ways to manage stress, such as: Meditation, yoga, or listening to music. Journaling. Talking to a trusted person. Spending time with friends and family. Minimize exposure to UV radiation to reduce your risk of skin cancer. Safety Always wear your seat belt while driving or riding in a vehicle. Do not drive: If you have been drinking alcohol. Do not ride with someone who has been drinking. When you are tired or distracted. While texting. If you have been using any mind-altering substances or drugs. Wear a helmet and other protective equipment during sports activities. If you have firearms in your house, make sure you follow all gun safety procedures. Seek help if you have been physically or sexually abused. What's next? Visit your health care provider once a year for an annual wellness visit. Ask your health care provider how often you should have your eyes and teeth checked. Stay up to date on all vaccines. This information is not intended to replace advice given to you by your health care provider. Make sure you discuss any questions you have with your health care provider. Document Revised: 07/23/2020 Document Reviewed: 07/23/2020 Elsevier Patient Education  2024 [employer]  

## 2022-11-17 LAB — CBC WITH DIFFERENTIAL/PLATELET
Basophils Absolute: 0 10*3/uL (ref 0.0–0.2)
Basos: 0 %
EOS (ABSOLUTE): 0.2 10*3/uL (ref 0.0–0.4)
Eos: 2 %
Hematocrit: 45.9 % (ref 34.0–46.6)
Hemoglobin: 14.7 g/dL (ref 11.1–15.9)
Immature Grans (Abs): 0 10*3/uL (ref 0.0–0.1)
Immature Granulocytes: 0 %
Lymphocytes Absolute: 2 10*3/uL (ref 0.7–3.1)
Lymphs: 29 %
MCH: 29.8 pg (ref 26.6–33.0)
MCHC: 32 g/dL (ref 31.5–35.7)
MCV: 93 fL (ref 79–97)
Monocytes Absolute: 0.5 10*3/uL (ref 0.1–0.9)
Monocytes: 7 %
Neutrophils Absolute: 4.2 10*3/uL (ref 1.4–7.0)
Neutrophils: 62 %
Platelets: 194 10*3/uL (ref 150–450)
RBC: 4.94 x10E6/uL (ref 3.77–5.28)
RDW: 11.3 % — ABNORMAL LOW (ref 11.7–15.4)
WBC: 6.9 10*3/uL (ref 3.4–10.8)

## 2022-11-17 LAB — CMP14+EGFR
ALT: 14 [IU]/L (ref 0–32)
AST: 18 [IU]/L (ref 0–40)
Albumin: 4.5 g/dL (ref 3.9–4.9)
Alkaline Phosphatase: 36 [IU]/L — ABNORMAL LOW (ref 44–121)
BUN/Creatinine Ratio: 14 (ref 9–23)
BUN: 12 mg/dL (ref 6–24)
Bilirubin Total: 1 mg/dL (ref 0.0–1.2)
CO2: 22 mmol/L (ref 20–29)
Calcium: 9 mg/dL (ref 8.7–10.2)
Chloride: 102 mmol/L (ref 96–106)
Creatinine, Ser: 0.86 mg/dL (ref 0.57–1.00)
Globulin, Total: 1.9 g/dL (ref 1.5–4.5)
Glucose: 83 mg/dL (ref 70–99)
Potassium: 3.9 mmol/L (ref 3.5–5.2)
Sodium: 140 mmol/L (ref 134–144)
Total Protein: 6.4 g/dL (ref 6.0–8.5)
eGFR: 86 mL/min/{1.73_m2} (ref >59)

## 2022-11-17 LAB — LIPID PANEL
Chol/HDL Ratio: 2.1 {ratio} (ref 0.0–4.4)
Cholesterol, Total: 177 mg/dL (ref 100–199)
HDL: 83 mg/dL (ref >39)
LDL Chol Calc (NIH): 82 mg/dL (ref 0–99)
Triglycerides: 65 mg/dL (ref 0–149)
VLDL Cholesterol Cal: 12 mg/dL (ref 5–40)

## 2022-11-22 ENCOUNTER — Telehealth: Payer: Self-pay

## 2022-11-22 NOTE — Telephone Encounter (Signed)
Faxed and made copy for scan.

## 2022-11-22 NOTE — Telephone Encounter (Signed)
Completed Biometric Screening form and placed in your form folder for your signature.

## 2022-11-24 ENCOUNTER — Telehealth: Payer: Self-pay | Admitting: Medical-Surgical

## 2022-11-24 NOTE — Telephone Encounter (Signed)
Patient called to follow up on a form [employer] Screening form please advise

## 2022-11-26 ENCOUNTER — Encounter: Payer: Self-pay | Admitting: Medical-Surgical

## 2022-11-26 NOTE — Telephone Encounter (Signed)
I faxed it 11/22/2022

## 2022-12-04 IMAGING — MG DIGITAL DIAGNOSTIC BILAT W/ TOMO W/ CAD
8 of 14 series · 8 of 40 positions shown · non-contrast
Comparison: Previous exam(s).

CLINICAL DATA: Patient presents complaining of bilateral breast
pain, more focal and localized and persistent in the upper right
breast. No reported lumps.

EXAM:
DIGITAL DIAGNOSTIC BILATERAL MAMMOGRAM WITH TOMOSYNTHESIS AND CAD;
ULTRASOUND RIGHT BREAST LIMITED
TECHNIQUE: Bilateral digital diagnostic mammography and breast tomosynthesis
was performed. The images were evaluated with computer-aided
detection.; Targeted ultrasound examination of the right breast was
performed

[L XCCL synth-2D]
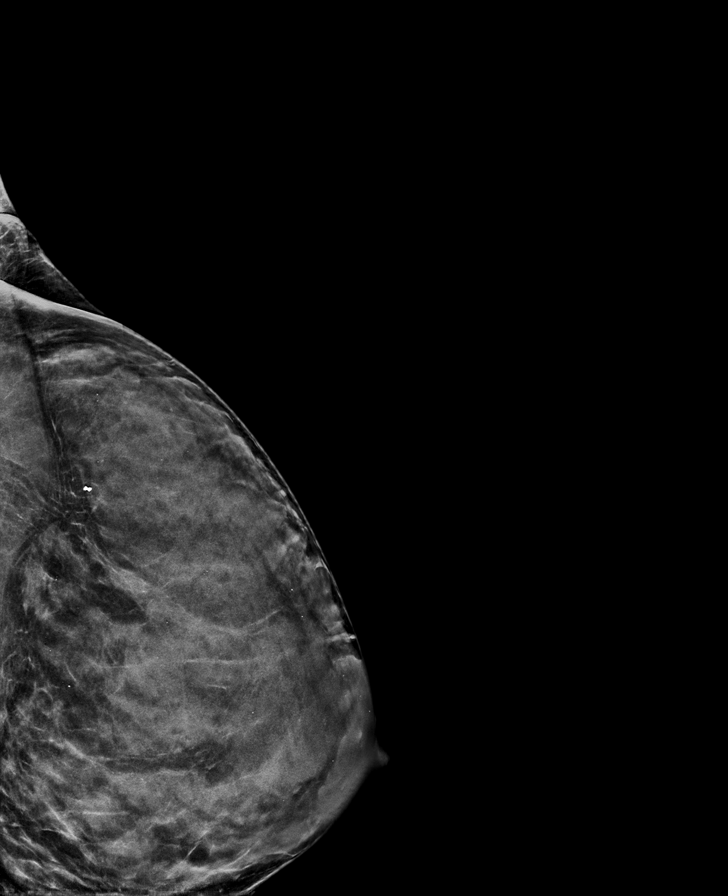

[R CC synth-2D]
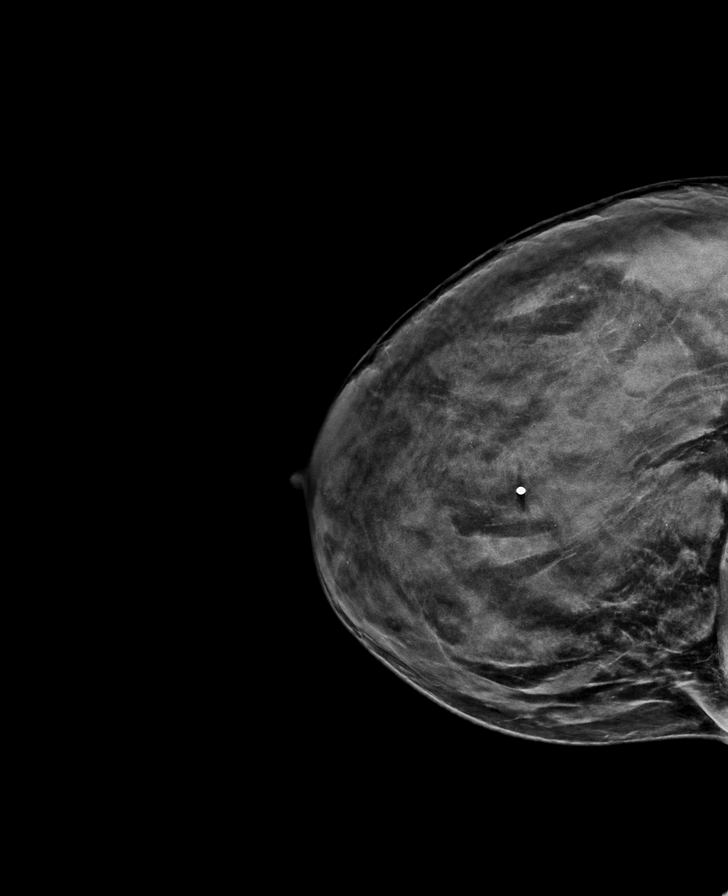

[L MLO synth-2D]
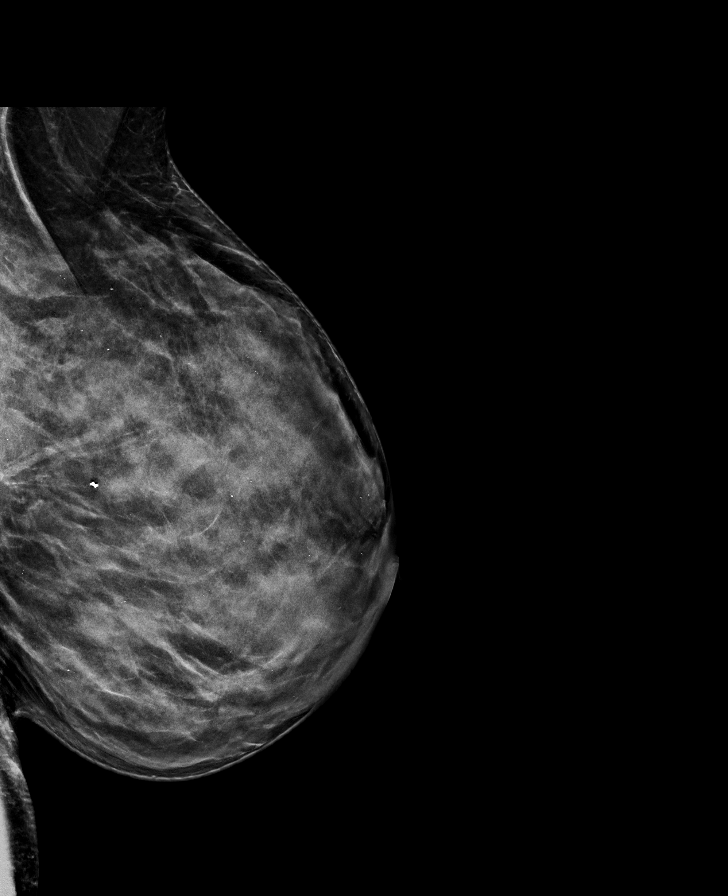

[R MLO synth-2D (1 of 2)]
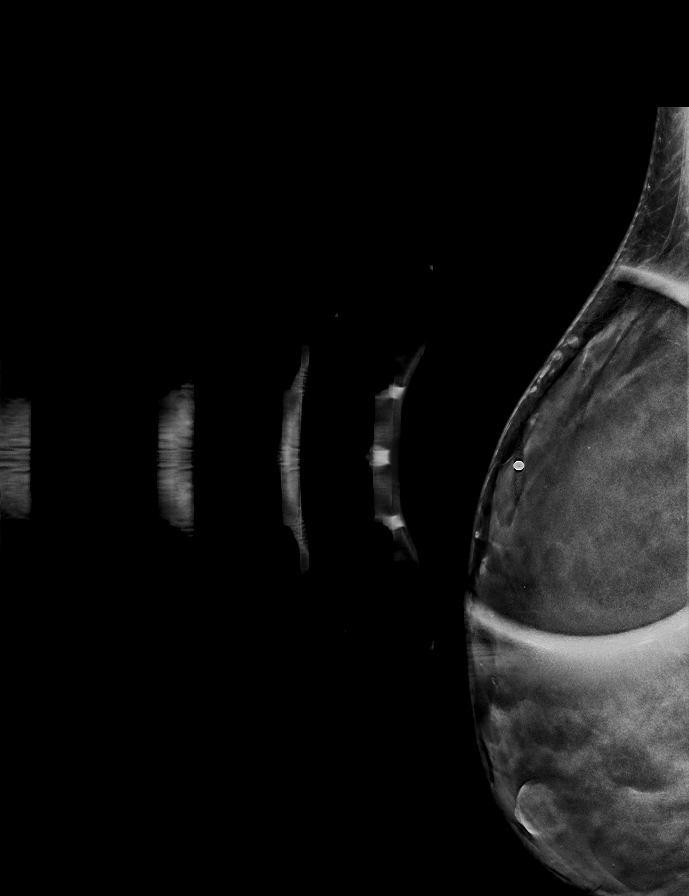

[L CC synth-2D]
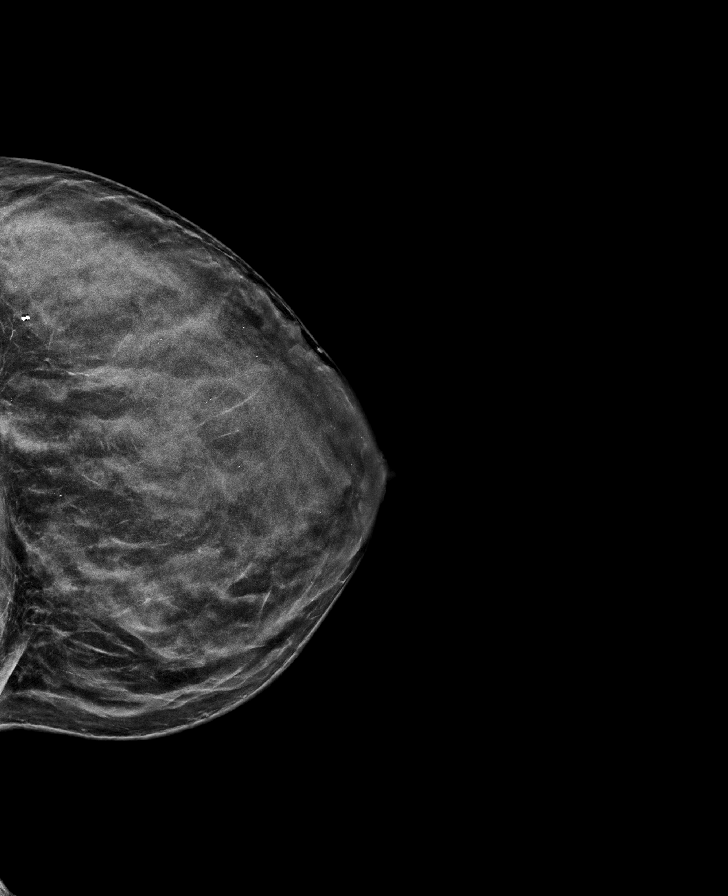

[R XCCL synth-2D]
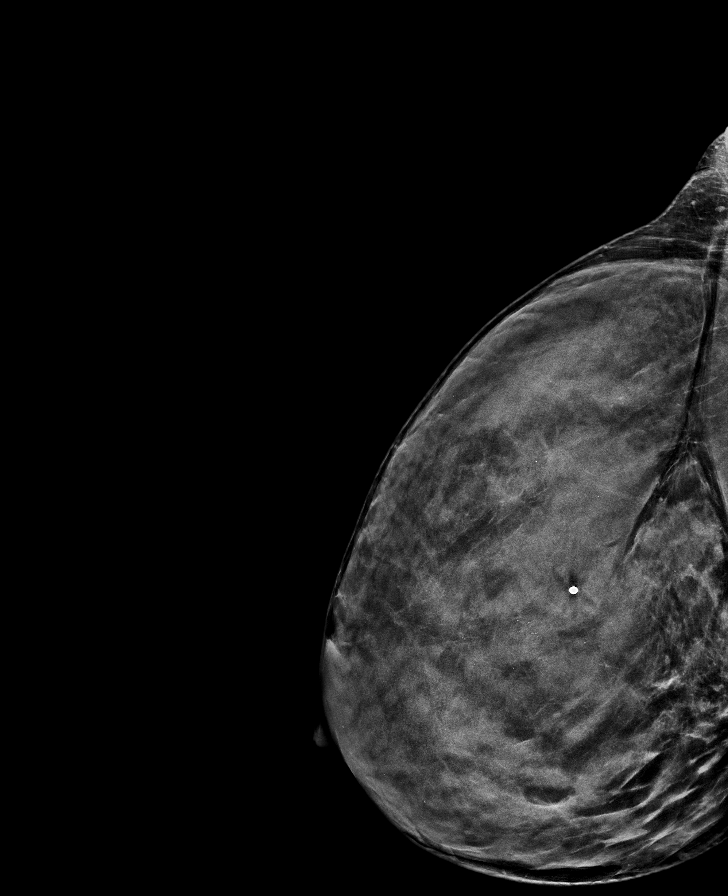

[R MLO synth-2D (2 of 2)]
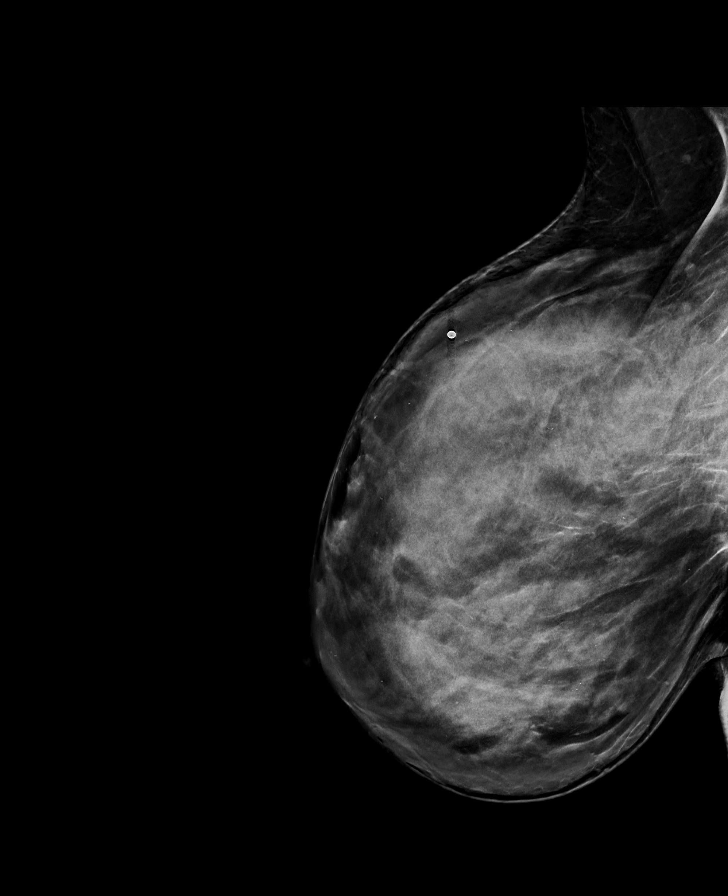

[R MLO tomo · tomo slice 39/77.0]
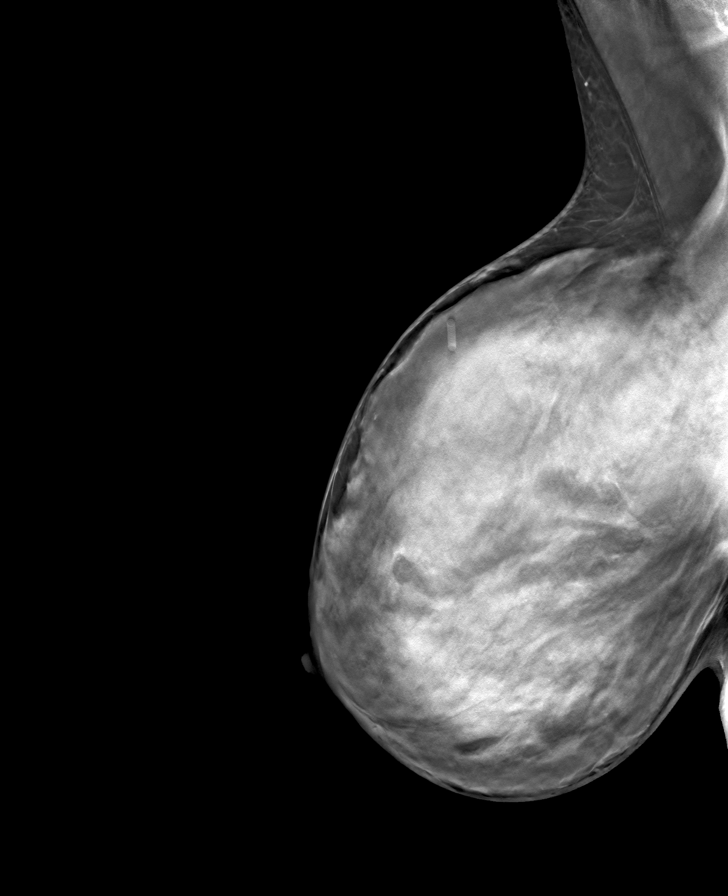

[8 of 40 positions shown; findings below may reference images not displayed]

ACR Breast Density Category d: The breast tissue is extremely dense,
which lowers the sensitivity of mammography.
FINDINGS: There are no masses, areas of architectural distortion, areas of
significant asymmetry or suspicious calcifications. A barbell shaped
biopsy clip lies in the posterolateral left breast from a prior
benign biopsy that revealed fibrocystic changes.

Targeted ultrasound is performed, showing heterogeneous
fibroglandular tissue with a few small cysts, but no solid masses or
suspicious lesions. Sonographic imaging was targeted to the upper
right breast in the area of focal pain.
IMPRESSION: 1. No evidence of breast malignancy.
2. Few scattered small benign cysts the right breast.

RECOMMENDATION:
Screening mammogram in one year.(Code:YI-[id number])

I have discussed the findings and recommendations with the patient.
If applicable, a reminder letter will be sent to the patient
regarding the next appointment.

BI-RADS CATEGORY  2: Benign.

## 2023-05-23 DIAGNOSIS — Z01419 Encounter for gynecological examination (general) (routine) without abnormal findings: Secondary | ICD-10-CM | POA: Diagnosis not present

## 2023-05-23 DIAGNOSIS — Z1239 Encounter for other screening for malignant neoplasm of breast: Secondary | ICD-10-CM | POA: Diagnosis not present

## 2023-05-31 ENCOUNTER — Other Ambulatory Visit (HOSPITAL_COMMUNITY): Payer: Self-pay

## 2023-07-05 DIAGNOSIS — Z1239 Encounter for other screening for malignant neoplasm of breast: Secondary | ICD-10-CM | POA: Diagnosis not present

## 2023-07-05 DIAGNOSIS — N6002 Solitary cyst of left breast: Secondary | ICD-10-CM | POA: Diagnosis not present

## 2023-07-05 DIAGNOSIS — N6001 Solitary cyst of right breast: Secondary | ICD-10-CM | POA: Diagnosis not present

## 2023-07-05 DIAGNOSIS — Z1231 Encounter for screening mammogram for malignant neoplasm of breast: Secondary | ICD-10-CM | POA: Diagnosis not present

## 2023-07-05 LAB — HM MAMMOGRAPHY

## 2023-09-15 ENCOUNTER — Encounter: Payer: Self-pay | Admitting: Medical-Surgical

## 2023-11-02 DIAGNOSIS — D2261 Melanocytic nevi of right upper limb, including shoulder: Secondary | ICD-10-CM | POA: Diagnosis not present

## 2023-11-02 DIAGNOSIS — D2272 Melanocytic nevi of left lower limb, including hip: Secondary | ICD-10-CM | POA: Diagnosis not present

## 2023-11-02 DIAGNOSIS — D2262 Melanocytic nevi of left upper limb, including shoulder: Secondary | ICD-10-CM | POA: Diagnosis not present

## 2023-11-02 DIAGNOSIS — L570 Actinic keratosis: Secondary | ICD-10-CM | POA: Diagnosis not present
